# Patient Record
Sex: Female | Born: 1955 | Race: White | Hispanic: No | Marital: Married | Smoking: Never smoker
Health system: Southern US, Community
[De-identification: ages and names within clinical notes are randomized; demographics above are authoritative.]

## PROBLEM LIST (undated history)

## (undated) DIAGNOSIS — I83811 Varicose veins of right lower extremities with pain: Secondary | ICD-10-CM

## (undated) DIAGNOSIS — M25511 Pain in right shoulder: Secondary | ICD-10-CM

## (undated) DIAGNOSIS — M810 Age-related osteoporosis without current pathological fracture: Secondary | ICD-10-CM

## (undated) DIAGNOSIS — G43909 Migraine, unspecified, not intractable, without status migrainosus: Secondary | ICD-10-CM

## (undated) DIAGNOSIS — M5020 Other cervical disc displacement, unspecified cervical region: Secondary | ICD-10-CM

## (undated) DIAGNOSIS — I341 Nonrheumatic mitral (valve) prolapse: Secondary | ICD-10-CM

## (undated) DIAGNOSIS — E673 Hypervitaminosis D: Secondary | ICD-10-CM

## (undated) DIAGNOSIS — I493 Ventricular premature depolarization: Secondary | ICD-10-CM

## (undated) DIAGNOSIS — H3530 Unspecified macular degeneration: Secondary | ICD-10-CM

## (undated) DIAGNOSIS — N949 Unspecified condition associated with female genital organs and menstrual cycle: Secondary | ICD-10-CM

## (undated) DIAGNOSIS — J329 Chronic sinusitis, unspecified: Secondary | ICD-10-CM

## (undated) DIAGNOSIS — M6289 Other specified disorders of muscle: Secondary | ICD-10-CM

## (undated) HISTORY — DX: Other specified disorders of muscle: M62.89

## (undated) HISTORY — DX: Nonrheumatic mitral (valve) prolapse: I34.1

## (undated) HISTORY — DX: Ventricular premature depolarization: I49.3

## (undated) HISTORY — DX: Other cervical disc displacement, unspecified cervical region: M50.20

## (undated) HISTORY — PX: KNEE ARTHROSCOPY: SUR90

## (undated) HISTORY — DX: Pain in right shoulder: M25.511

## (undated) HISTORY — PX: APPENDECTOMY: SHX54

## (undated) HISTORY — DX: Unspecified condition associated with female genital organs and menstrual cycle: N94.9

## (undated) HISTORY — DX: Varicose veins of right lower extremity with pain: I83.811

## (undated) HISTORY — DX: Migraine, unspecified, not intractable, without status migrainosus: G43.909

## (undated) HISTORY — DX: Chronic sinusitis, unspecified: J32.9

## (undated) HISTORY — PX: BREAST LUMPECTOMY: SHX2

## (undated) HISTORY — DX: Hypervitaminosis D: E67.3

---

## 2004-11-24 ENCOUNTER — Ambulatory Visit: Payer: Self-pay | Admitting: Internal Medicine

## 2007-09-05 ENCOUNTER — Ambulatory Visit: Payer: Self-pay | Admitting: Internal Medicine

## 2008-11-19 ENCOUNTER — Ambulatory Visit: Payer: Self-pay | Admitting: Family Medicine

## 2008-11-20 ENCOUNTER — Ambulatory Visit: Payer: Self-pay | Admitting: Family Medicine

## 2010-12-09 ENCOUNTER — Ambulatory Visit: Payer: Self-pay | Admitting: Family Medicine

## 2010-12-10 ENCOUNTER — Ambulatory Visit: Payer: Self-pay | Admitting: Family Medicine

## 2015-06-22 ENCOUNTER — Emergency Department (HOSPITAL_COMMUNITY)
Admission: EM | Admit: 2015-06-22 | Discharge: 2015-06-22 | Disposition: A | Discharge status: Home / Self Care | Payer: BLUE CROSS/BLUE SHIELD | Attending: Emergency Medicine | Admitting: Emergency Medicine

## 2015-06-22 ENCOUNTER — Emergency Department (HOSPITAL_COMMUNITY): Payer: BLUE CROSS/BLUE SHIELD

## 2015-06-22 ENCOUNTER — Encounter (HOSPITAL_COMMUNITY): Payer: Self-pay | Admitting: Emergency Medicine

## 2015-06-22 DIAGNOSIS — Z79899 Other long term (current) drug therapy: Secondary | ICD-10-CM | POA: Insufficient documentation

## 2015-06-22 DIAGNOSIS — Z8679 Personal history of other diseases of the circulatory system: Secondary | ICD-10-CM | POA: Diagnosis not present

## 2015-06-22 DIAGNOSIS — Z9089 Acquired absence of other organs: Secondary | ICD-10-CM | POA: Insufficient documentation

## 2015-06-22 DIAGNOSIS — R1011 Right upper quadrant pain: Secondary | ICD-10-CM | POA: Diagnosis not present

## 2015-06-22 DIAGNOSIS — M25511 Pain in right shoulder: Secondary | ICD-10-CM | POA: Diagnosis present

## 2015-06-22 DIAGNOSIS — M549 Dorsalgia, unspecified: Secondary | ICD-10-CM | POA: Insufficient documentation

## 2015-06-22 DIAGNOSIS — M898X1 Other specified disorders of bone, shoulder: Secondary | ICD-10-CM

## 2015-06-22 HISTORY — DX: Nonrheumatic mitral (valve) prolapse: I34.1

## 2015-06-22 LAB — COMPREHENSIVE METABOLIC PANEL
ALT: 14 U/L (ref 14–54)
AST: 25 U/L (ref 15–41)
Albumin: 4.3 g/dL (ref 3.5–5.0)
Alkaline Phosphatase: 78 U/L (ref 38–126)
Anion gap: 9 (ref 5–15)
BUN: 13 mg/dL (ref 6–20)
CO2: 29 mmol/L (ref 22–32)
Calcium: 9.2 mg/dL (ref 8.9–10.3)
Chloride: 98 mmol/L — ABNORMAL LOW (ref 101–111)
Creatinine, Ser: 0.78 mg/dL (ref 0.44–1.00)
GFR calc Af Amer: >60 mL/min (ref >60)
GFR calc non Af Amer: >60 mL/min (ref >60)
Glucose, Bld: 94 mg/dL (ref 65–99)
Potassium: 4.1 mmol/L (ref 3.5–5.1)
Sodium: 136 mmol/L (ref 135–145)
Total Bilirubin: 0.8 mg/dL (ref 0.3–1.2)
Total Protein: 7.4 g/dL (ref 6.5–8.1)

## 2015-06-22 LAB — URINE MICROSCOPIC-ADD ON

## 2015-06-22 LAB — CBC WITH DIFFERENTIAL/PLATELET
Basophils Absolute: 0 10*3/uL (ref 0.0–0.1)
Basophils Relative: 1 % (ref 0–1)
Eosinophils Absolute: 0.1 10*3/uL (ref 0.0–0.7)
Eosinophils Relative: 3 % (ref 0–5)
HCT: 39.2 % (ref 36.0–46.0)
Hemoglobin: 13.5 g/dL (ref 12.0–15.0)
Lymphocytes Relative: 30 % (ref 12–46)
Lymphs Abs: 1.3 10*3/uL (ref 0.7–4.0)
MCH: 30.7 pg (ref 26.0–34.0)
MCHC: 34.4 g/dL (ref 30.0–36.0)
MCV: 89.1 fL (ref 78.0–100.0)
Monocytes Absolute: 0.3 10*3/uL (ref 0.1–1.0)
Monocytes Relative: 7 % (ref 3–12)
Neutro Abs: 2.7 10*3/uL (ref 1.7–7.7)
Neutrophils Relative %: 59 % (ref 43–77)
Platelets: 164 10*3/uL (ref 150–400)
RBC: 4.4 MIL/uL (ref 3.87–5.11)
RDW: 12.6 % (ref 11.5–15.5)
WBC: 4.4 10*3/uL (ref 4.0–10.5)

## 2015-06-22 LAB — URINALYSIS, ROUTINE W REFLEX MICROSCOPIC
Bilirubin Urine: NEGATIVE
Glucose, UA: NEGATIVE mg/dL
Hgb urine dipstick: NEGATIVE
Ketones, ur: NEGATIVE mg/dL
Nitrite: NEGATIVE
Protein, ur: NEGATIVE mg/dL
Specific Gravity, Urine: 1.012 (ref 1.005–1.030)
Urobilinogen, UA: 0.2 mg/dL (ref 0.0–1.0)
pH: 7 (ref 5.0–8.0)

## 2015-06-22 LAB — LIPASE, BLOOD: Lipase: 25 U/L (ref 22–51)

## 2015-06-22 MED ORDER — OXYCODONE-ACETAMINOPHEN 5-325 MG PO TABS: 2.0000 | ORAL_TABLET | ORAL | Status: DC | PRN

## 2015-06-22 MED ORDER — METHOCARBAMOL 500 MG PO TABS
500.0000 mg | ORAL_TABLET | Freq: Once | ORAL | Status: AC
  Administered 2015-06-22: 500 mg via ORAL
  Filled 2015-06-22: qty 1

## 2015-06-22 MED ORDER — METHOCARBAMOL 500 MG PO TABS: 500.0000 mg | ORAL_TABLET | Freq: Two times a day (BID) | ORAL | Status: DC

## 2015-06-22 MED ORDER — MORPHINE SULFATE 4 MG/ML IJ SOLN
4.0000 mg | Freq: Once | INTRAMUSCULAR | Status: AC
  Administered 2015-06-22: 3 mg via INTRAVENOUS
  Filled 2015-06-22: qty 1

## 2015-06-22 NOTE — Discharge Instructions (Signed)
Musculoskeletal Pain Return for any shortness of breath or chest pain. Follow-up with your physician and [facility].  Musculoskeletal pain is muscle and boney aches and pains. These pains can occur in any part of the body. Your caregiver may treat you without knowing the cause of the pain. They may treat you if blood or urine tests, X-rays, and other tests were normal.  CAUSES There is often not a definite cause or reason for these pains. These pains may be caused by a type of germ (virus). The discomfort may also come from overuse. Overuse includes working out too hard when your body is not fit. Boney aches also come from weather changes. Bone is sensitive to atmospheric pressure changes. HOME CARE INSTRUCTIONS   Ask when your test results will be ready. Make sure you get your test results.  Only take over-the-counter or prescription medicines for pain, discomfort, or fever as directed by your caregiver. If you were given medications for your condition, do not drive, operate machinery or power tools, or sign legal documents for 24 hours. Do not drink alcohol. Do not take sleeping pills or other medications that may interfere with treatment.  Continue all activities unless the activities cause more pain. When the pain lessens, slowly resume normal activities. Gradually increase the intensity and duration of the activities or exercise.  During periods of severe pain, bed rest may be helpful. Lay or sit in any position that is comfortable.  Putting ice on the injured area.  Put ice in a bag.  Place a towel between your skin and the bag.  Leave the ice on for 15 to 20 minutes, 3 to 4 times a day.  Follow up with your caregiver for continued problems and no reason can be found for the pain. If the pain becomes worse or does not go away, it may be necessary to repeat tests or do additional testing. Your caregiver may need to look further for a possible cause. SEEK IMMEDIATE MEDICAL CARE IF:  You have  pain that is getting worse and is not relieved by medications.  You develop chest pain that is associated with shortness or breath, sweating, feeling sick to your stomach (nauseous), or throw up (vomit).  Your pain becomes localized to the abdomen.  You develop any new symptoms that seem different or that concern you. MAKE SURE YOU:   Understand these instructions.  Will watch your condition.  Will get help right away if you are not doing well or get worse. Document Released: 12/07/2005 Document Revised: 02/29/2012 Document Reviewed: 08/11/2013 [facility] Patient Information 2015 [place], [place]. This information is not intended to replace advice given to you by your health care provider. Make sure you discuss any questions you have with your health care provider.

## 2015-06-22 NOTE — ED Provider Notes (Signed)
CSN: [id number]     Arrival date & time 06/22/15  1054 History   First MD Initiated Contact with Patient 06/22/15 1138     Chief Complaint  Patient presents with  . Back Pain     (Consider location/radiation/quality/duration/timing/severity/associated sxs/prior Treatment) Patient is a 59 y.o. female presenting with back pain. The history is provided by the patient. No language interpreter was used.  Back Pain Associated symptoms: abdominal pain   Associated symptoms: no dysuria and no fever   Ms. Benham is a 59 year old female with a history of mitral valve prolapse and appendectomy who presents for sharp, constant right shoulder pain that has progressively worsened for the past 4 days. She took ibuprofen with minimal relief. She states that ice does help as well as distraction. She states that when she is working she does not notice it until she stops doing something. She states she was seen by her chiropractor 2 days ago and he thought it may be her gallbladder. Her last bowel movement was normal this morning. She denies that it is worse after eating. She denies any injury or fall. She denies any fever, chills, chest pain, shortness of breath, cough, nausea, vomiting, diarrhea, constipation, hematochezia, dysuria, hematuria, urinary frequency, vaginal bleeding or discharge. She is postmenopausal. Past Medical History  Diagnosis Date  . MVP (mitral valve prolapse)    Past Surgical History  Procedure Laterality Date  . Appendectomy    . Cesarean section     History reviewed. No pertinent family history. History  Substance Use Topics  . Smoking status: Never Smoker   . Smokeless tobacco: Not on file  . Alcohol Use: No   OB History    No data available     Review of Systems  Constitutional: Negative for fever.  Gastrointestinal: Positive for abdominal pain. Negative for nausea, vomiting, diarrhea, constipation, blood in stool and abdominal distention.  Genitourinary: Negative for  dysuria.  Musculoskeletal: Positive for back pain.  Skin: Negative for rash.  All other systems reviewed and are negative.     Allergies  Chocolate; Contrast media; Gluten meal; Sulfa antibiotics; and Sulfate  Home Medications   Prior to Admission medications   Medication Sig Start Date End Date Taking? Authorizing Provider  Multiple Vitamin (MULTIVITAMIN WITH MINERALS) TABS tablet Take 1 tablet by mouth daily.   Yes Historical Provider, MD  Omega-3 Fatty Acids (FISH OIL) 1000 MG CAPS Take 1 capsule by mouth 2 (two) times daily.   Yes Historical Provider, MD  methocarbamol (ROBAXIN) 500 MG tablet Take 1 tablet (500 mg total) by mouth 2 (two) times daily. 06/22/15   Analy Bassford Patel-Mills, PA-C  oxyCODONE-acetaminophen (PERCOCET/ROXICET) 5-325 MG per tablet Take 2 tablets by mouth every 4 (four) hours as needed for severe pain. 06/22/15   Francois Elk Patel-Mills, PA-C   BP 158/85 mmHg  Pulse 63  Temp(Src) 98.1 F (36.7 C) (Oral)  Resp 16  SpO2 99% Physical Exam  Constitutional: She is oriented to person, place, and time. She appears well-developed and well-nourished.  HENT:  Head: Normocephalic and atraumatic.  Eyes: Conjunctivae are normal.  Neck: Normal range of motion. Neck supple.  Cardiovascular: Normal rate, regular rhythm and normal heart sounds.   Pulmonary/Chest: Effort normal and breath sounds normal.  Abdominal: Soft. Normal appearance. She exhibits no distension and no mass. There is no splenomegaly or hepatomegaly. There is no rigidity, no rebound, no guarding, no CVA tenderness, no tenderness at McBurney's point and negative Murphy's sign.    Musculoskeletal: Normal range  of motion.  No reproducible right scapular or right shoulder pain with palpation. Patient is able to abduct and adduct the right arm. She has a good radial pulse. Good strength. No scapular winging.  Neurological: She is alert and oriented to person, place, and time.  Skin: Skin is warm and dry.  Psychiatric:  She has a normal mood and affect. Her behavior is normal.  Nursing note and vitals reviewed.   ED Course  Procedures (including critical care time) Labs Review Labs Reviewed  COMPREHENSIVE METABOLIC PANEL - Abnormal; Notable for the following:    Chloride 98 (*)    All other components within normal limits  URINALYSIS, ROUTINE W REFLEX MICROSCOPIC (NOT AT [facility]) - Abnormal; Notable for the following:    APPearance CLOUDY (*)    Leukocytes, UA TRACE (*)    All other components within normal limits  URINE MICROSCOPIC-ADD ON - Abnormal; Notable for the following:    Squamous Epithelial / LPF MANY (*)    Bacteria, UA FEW (*)    All other components within normal limits  CBC WITH DIFFERENTIAL/PLATELET  LIPASE, BLOOD    Imaging Review US Abdomen Limited Ruq  06/22/2015   CLINICAL DATA:  Right upper quadrant pain for 4 days.  EXAM: US ABDOMEN LIMITED - RIGHT UPPER QUADRANT  COMPARISON:  None.  FINDINGS: Gallbladder:  Gallbladder is mildly distended but there is no evidence for stones or wall thickening. Sonographic Murphy's sign could not be evaluated because the patient was medicated.  Common bile duct:  Diameter: 6 mm  Liver:  No focal lesion identified. Within normal limits in parenchymal echogenicity.  IMPRESSION: No acute abnormality. Negative for gallstones or biliary dilatation.   Electronically Signed   By: Markus Daft M.D.   On: 06/22/2015 13:45     EKG Interpretation None      MDM   Final diagnoses:  RUQ pain  Pain of right scapula  Patient's vitals are stable and she is afebrile. Her labs are unremarkable. She has no UTI. Lipase is normal. Ultrasound of the right upper quadrant shows normal common bile duct with no cholelithiasis or cholecystitis. I was not concerned about a PE since the patient was not short of breath, had no cough, no recent surgery or estrogen use, no prior DVT or PE. I think patient's pain may be more musculoskeletal. I have given the patient robaxin and  percocet. She states that she can follow-up with her family physician at [facility] in 3 days. I gave the patient return precautions such as shortness of breath, vomiting, fever.    Ottie Glazier, PA-C 06/22/15 1547  Orlie Dakin, MD 06/22/15 1556

## 2015-06-22 NOTE — ED Notes (Addendum)
Pt c/o right scapula pain starting Wednesday that has increased since then. Has seen chiropractor and told her it was probably her gall bladder. Denies N/V/D. Denies urinary or bowel changes. Denies any other symptoms. No other c/c. 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 Says pain is constant and is not exacerbated or alleviated by any particular thing. Ambulatory with steady gait.

## 2015-06-26 ENCOUNTER — Ambulatory Visit
Admission: RE | Admit: 2015-06-26 | Discharge: 2015-06-26 | Disposition: A | Discharge status: Home / Self Care | Payer: BLUE CROSS/BLUE SHIELD | Source: Ambulatory Visit | Attending: Family Medicine | Admitting: Family Medicine

## 2015-06-26 ENCOUNTER — Other Ambulatory Visit: Payer: Self-pay | Admitting: Family Medicine

## 2015-06-26 DIAGNOSIS — M542 Cervicalgia: Secondary | ICD-10-CM

## 2015-06-26 DIAGNOSIS — M549 Dorsalgia, unspecified: Secondary | ICD-10-CM

## 2016-01-23 ENCOUNTER — Other Ambulatory Visit: Payer: Self-pay | Admitting: Family Medicine

## 2016-01-23 ENCOUNTER — Other Ambulatory Visit (HOSPITAL_COMMUNITY)
Admission: RE | Admit: 2016-01-23 | Discharge: 2016-01-23 | Disposition: A | Discharge status: Home / Self Care | Payer: BLUE CROSS/BLUE SHIELD | Source: Ambulatory Visit | Attending: Family Medicine | Admitting: Family Medicine

## 2016-01-23 DIAGNOSIS — Z124 Encounter for screening for malignant neoplasm of cervix: Secondary | ICD-10-CM | POA: Insufficient documentation

## 2016-01-24 ENCOUNTER — Other Ambulatory Visit: Payer: Self-pay

## 2016-01-24 DIAGNOSIS — Z1231 Encounter for screening mammogram for malignant neoplasm of breast: Secondary | ICD-10-CM

## 2016-01-28 LAB — CYTOLOGY - PAP

## 2016-02-10 ENCOUNTER — Ambulatory Visit
Admission: RE | Admit: 2016-02-10 | Discharge: 2016-02-10 | Disposition: A | Discharge status: Home / Self Care | Payer: BLUE CROSS/BLUE SHIELD | Source: Ambulatory Visit

## 2016-02-10 DIAGNOSIS — Z1231 Encounter for screening mammogram for malignant neoplasm of breast: Secondary | ICD-10-CM

## 2019-08-01 ENCOUNTER — Other Ambulatory Visit: Payer: Self-pay | Admitting: Family Medicine

## 2019-08-01 ENCOUNTER — Other Ambulatory Visit (HOSPITAL_COMMUNITY)
Admission: RE | Admit: 2019-08-01 | Discharge: 2019-08-01 | Disposition: A | Discharge status: Home / Self Care | Payer: Medicare Other | Source: Ambulatory Visit | Attending: Family Medicine | Admitting: Family Medicine

## 2019-08-01 DIAGNOSIS — Z780 Asymptomatic menopausal state: Secondary | ICD-10-CM | POA: Insufficient documentation

## 2019-08-01 DIAGNOSIS — M8588 Other specified disorders of bone density and structure, other site: Secondary | ICD-10-CM | POA: Diagnosis present

## 2019-08-01 DIAGNOSIS — Z1151 Encounter for screening for human papillomavirus (HPV): Secondary | ICD-10-CM | POA: Insufficient documentation

## 2019-08-03 LAB — CYTOLOGY - PAP
Diagnosis: NEGATIVE
HPV: NOT DETECTED

## 2019-11-01 ENCOUNTER — Other Ambulatory Visit: Payer: Self-pay | Admitting: Family Medicine

## 2019-11-01 DIAGNOSIS — R109 Unspecified abdominal pain: Secondary | ICD-10-CM

## 2019-11-07 ENCOUNTER — Ambulatory Visit
Admission: RE | Admit: 2019-11-07 | Discharge: 2019-11-07 | Disposition: A | Discharge status: Home / Self Care | Payer: Medicare Other | Source: Ambulatory Visit | Attending: Family Medicine | Admitting: Family Medicine

## 2019-11-07 DIAGNOSIS — R109 Unspecified abdominal pain: Secondary | ICD-10-CM

## 2020-03-14 ENCOUNTER — Other Ambulatory Visit: Payer: Self-pay | Admitting: Family Medicine

## 2020-03-14 DIAGNOSIS — Z1231 Encounter for screening mammogram for malignant neoplasm of breast: Secondary | ICD-10-CM

## 2020-03-25 ENCOUNTER — Other Ambulatory Visit: Payer: Self-pay

## 2020-03-25 ENCOUNTER — Ambulatory Visit
Admission: RE | Admit: 2020-03-25 | Discharge: 2020-03-25 | Disposition: A | Discharge status: Home / Self Care | Payer: Medicare Other | Source: Ambulatory Visit

## 2020-03-25 DIAGNOSIS — Z1231 Encounter for screening mammogram for malignant neoplasm of breast: Secondary | ICD-10-CM

## 2020-08-05 ENCOUNTER — Other Ambulatory Visit: Payer: Self-pay | Admitting: Family Medicine

## 2020-08-05 DIAGNOSIS — M8588 Other specified disorders of bone density and structure, other site: Secondary | ICD-10-CM

## 2020-09-03 ENCOUNTER — Ambulatory Visit (INDEPENDENT_AMBULATORY_CARE_PROVIDER_SITE_OTHER): Payer: Medicare Other | Admitting: Ophthalmology

## 2020-09-03 ENCOUNTER — Other Ambulatory Visit: Payer: Self-pay

## 2020-09-03 ENCOUNTER — Encounter (INDEPENDENT_AMBULATORY_CARE_PROVIDER_SITE_OTHER): Payer: Self-pay | Admitting: Ophthalmology

## 2020-09-03 DIAGNOSIS — H2511 Age-related nuclear cataract, right eye: Secondary | ICD-10-CM

## 2020-09-03 DIAGNOSIS — H353132 Nonexudative age-related macular degeneration, bilateral, intermediate dry stage: Secondary | ICD-10-CM

## 2020-09-03 DIAGNOSIS — H43391 Other vitreous opacities, right eye: Secondary | ICD-10-CM | POA: Diagnosis not present

## 2020-09-03 DIAGNOSIS — H2512 Age-related nuclear cataract, left eye: Secondary | ICD-10-CM | POA: Diagnosis not present

## 2020-09-03 NOTE — Progress Notes (Signed)
09/03/2020     CHIEF COMPLAINT Patient presents for Retina Follow Up   HISTORY OF PRESENT ILLNESS: Alicia Green is a 64 y.o. female who presents to the clinic today for:   HPI    Retina Follow Up    Patient presents with  Dry AMD.  In both eyes.  Severity is moderate.  Duration of 1 year.  Since onset it is stable.  I, the attending physician,  performed the HPI with the patient and updated documentation appropriately.          Comments    1 Year f\u OU. OCT  Pt states no changes in vision. Denies any complaints. Pt saw Dr. Laban Emperor last week, would like [place] to send notes.       Last edited by Tilda Franco on 09/03/2020  8:08 AM. (History)      Referring physician: Lawerance Cruel, MD [place],  [place] [id number]  HISTORICAL INFORMATION:   Selected notes from the [place]: No current outpatient medications on file. (Ophthalmic Drugs)   No current facility-administered medications for this visit. (Ophthalmic Drugs)   Current Outpatient Medications (Other)  Medication Sig  . methocarbamol (ROBAXIN) 500 MG tablet Take 1 tablet (500 mg total) by mouth 2 (two) times daily.  . Multiple Vitamin (MULTIVITAMIN WITH MINERALS) TABS tablet Take 1 tablet by mouth daily.  . Omega-3 Fatty Acids (FISH OIL) 1000 MG CAPS Take 1 capsule by mouth 2 (two) times daily.  Marland Kitchen oxyCODONE-acetaminophen (PERCOCET/ROXICET) 5-325 MG per tablet Take 2 tablets by mouth every 4 (four) hours as needed for severe pain.   No current facility-administered medications for this visit. (Other)      REVIEW OF SYSTEMS:    ALLERGIES Allergies  Allergen Reactions  . Chocolate     migraines  . Contrast Media [Iodinated Diagnostic Agents] Swelling  . Gluten Meal     migraines  . Sulfa Antibiotics Itching  . Sulfate     migraines    PAST MEDICAL HISTORY Past Medical History:  Diagnosis Date  . MVP (mitral valve prolapse)    Past  Surgical History:  Procedure Laterality Date  . APPENDECTOMY    . CESAREAN SECTION      FAMILY HISTORY History reviewed. No pertinent family history.  SOCIAL HISTORY Social History   Tobacco Use  . Smoking status: Never Smoker  . Smokeless tobacco: Never Used  Substance Use Topics  . Alcohol use: No  . Drug use: Not on file         OPHTHALMIC EXAM:  Base Eye Exam    Visual Acuity (Snellen - Linear)      Right Left   Dist [place] 20/20 -1 20/25 -2       Tonometry (Tonopen, 8:12 AM)      Right Left   Pressure 12 11       Pupils      Pupils Dark Light Shape React APD   Right PERRL 4 3 Round Brisk None   Left PERRL 4 3 Round Brisk None       Visual Fields (Counting fingers)      Left Right    Full Full       Neuro/Psych    Oriented x3: Yes   Mood/Affect: Normal       Dilation    Both eyes: 1.0% Mydriacyl, 2.5% Phenylephrine @ 8:12 AM  Slit Lamp and Fundus Exam    External Exam      Right Left   External Normal Normal       Slit Lamp Exam      Right Left   Lids/Lashes Normal Normal   Conjunctiva/Sclera White and quiet White and quiet   Cornea Clear Clear   Anterior Chamber Deep and quiet Deep and quiet   Iris Round and reactive Round and reactive   Lens 1+ Nuclear sclerosis 1+ Nuclear sclerosis   Anterior Vitreous Normal Normal       Fundus Exam      Right Left   Posterior Vitreous Normal Normal   Disc Normal Normal   C/D Ratio 0.1 0.1   Macula Soft drusen, no exudates, no macular thickening Soft drusen, no exudates, no macular thickening   Vessels Normal Normal   Periphery Normal Normal          IMAGING AND PROCEDURES  Imaging and Procedures for 09/03/20  OCT, Retina - OU - Both Eyes       Right Eye Quality was good. Scan locations included subfoveal. Central Foveal Thickness: 300. Progression has been stable. Findings include abnormal foveal contour, no IRF, retinal drusen , no SRF.   Left Eye Quality was good. Scan  locations included subfoveal. Central Foveal Thickness: 300. Progression has been stable. Findings include abnormal foveal contour, retinal drusen , no IRF, no SRF.   Notes Posterior vitreous detachment OU, no vitreal retinal traction                ASSESSMENT/PLAN:  Intermediate stage nonexudative age-related macular degeneration of both eyes The nature of age--related macular degeneration was discussed with the patient as well as the distinction between dry and wet types. Checking an Amsler Grid daily with advice to return immediately should a distortion develop, was given to the patient. The patient 's smoking status now and in the past was determined and advice based on the AREDS study was provided regarding the consumption of antioxidant supplements. AREDS 2 vitamin formulation was recommended. Consumption of dark leafy vegetables and fresh fruits of various colors was recommended. Treatment modalities for wet macular degeneration particularly the use of intravitreal injections of anti-blood vessel growth factors was discussed with the patient. Avastin, Lucentis, and Eylea are the available options. On occasion, therapy includes the use of photodynamic therapy and thermal laser. Stressed to the patient do not rub eyes.  Patient was advised to check Amsler Grid daily and return immediately if changes are noted. Instructions on using the grid were given to the patient. All patient questions were answered.  Nuclear sclerotic cataract of left eye The nature of cataract was discussed with the patient as well as the elective nature of surgery. The patient was reassured that surgery at a later date does not put the patient at risk for a worse outcome. It was emphasized that the need for surgery is dictated by the patient's quality of life as influenced by the cataract. Patient was instructed to maintain close follow up with their general eye care doctor.      ICD-10-CM   1. Intermediate stage  nonexudative age-related macular degeneration of both eyes  H35.3132 OCT, Retina - OU - Both Eyes  2. Nuclear sclerotic cataract of right eye  H25.11   3. Other vitreous opacities, right eye  H43.391   4. Nuclear sclerotic cataract of left eye  H25.12     1.  Findings reviewed with the patient.  Mild to  intermediate age-related macular degeneration present in each eye.  No high risk features.  2.  She continues on oral PreserVision.  3.  Patient I did discuss the notion of possible magnesium supplementation to try to prevent or treat any type of ophthalmic migraine or other migraine type events.  Person active in sports and other activities, this would also replenish that lost in perspiration.  4.  I have asked the patient to follow-up over the course of the next year and a regularly scheduled basis with Dr. Willow Ora.  Patient understands we stand ready to help her and assist her in any way in this office should the need arise. Follow-up scheduled here in 2 years as a safety net  Ophthalmic Meds Ordered this visit:  No orders of the defined types were placed in this encounter.      Return in about 2 years (around 09/03/2022) for DILATE OU, OCT.  There are no Patient Instructions on file for this visit.   Explained the diagnoses, plan, and follow up with the patient and they expressed understanding.  Patient expressed understanding of the importance of proper follow up care.   Clent Demark Rickayla Wieland M.D. Diseases & Surgery of the Retina and Vitreous [facility] [place] 09/03/20     Abbreviations: M myopia (nearsighted); A astigmatism; H hyperopia (farsighted); P presbyopia; Mrx spectacle prescription;  CTL contact lenses; OD right eye; OS left eye; OU both eyes  XT exotropia; ET esotropia; PEK punctate epithelial keratitis; PEE punctate epithelial erosions; DES dry eye syndrome; MGD meibomian gland dysfunction; ATs artificial tears; PFAT's preservative free artificial tears; [place]  nuclear sclerotic cataract; PSC posterior subcapsular cataract; ERM epi-retinal membrane; PVD posterior vitreous detachment; RD retinal detachment; DM diabetes mellitus; DR diabetic retinopathy; NPDR non-proliferative diabetic retinopathy; PDR proliferative diabetic retinopathy; CSME clinically significant macular edema; DME diabetic macular edema; dbh dot blot hemorrhages; CWS cotton wool spot; POAG primary open angle glaucoma; C/D cup-to-disc ratio; HVF humphrey visual field; GVF goldmann visual field; OCT optical coherence tomography; IOP intraocular pressure; BRVO Branch retinal vein occlusion; CRVO central retinal vein occlusion; CRAO central retinal artery occlusion; BRAO branch retinal artery occlusion; RT retinal tear; SB scleral buckle; PPV pars plana vitrectomy; VH Vitreous hemorrhage; PRP panretinal laser photocoagulation; IVK intravitreal kenalog; VMT vitreomacular traction; MH Macular hole;  NVD neovascularization of the disc; NVE neovascularization elsewhere; AREDS age related eye disease study; ARMD age related macular degeneration; POAG primary open angle glaucoma; EBMD epithelial/anterior basement membrane dystrophy; ACIOL anterior chamber intraocular lens; IOL intraocular lens; PCIOL posterior chamber intraocular lens; Phaco/IOL phacoemulsification with intraocular lens placement; [place] photorefractive keratectomy; LASIK laser assisted in situ keratomileusis; HTN hypertension; DM diabetes mellitus; COPD chronic obstructive pulmonary disease

## 2020-09-03 NOTE — Assessment & Plan Note (Signed)

## 2020-09-03 NOTE — Assessment & Plan Note (Signed)

## 2020-10-23 ENCOUNTER — Other Ambulatory Visit: Payer: Self-pay

## 2020-10-23 ENCOUNTER — Ambulatory Visit (INDEPENDENT_AMBULATORY_CARE_PROVIDER_SITE_OTHER): Payer: Medicare Other | Admitting: Orthopedic Surgery

## 2020-10-23 DIAGNOSIS — M542 Cervicalgia: Secondary | ICD-10-CM | POA: Diagnosis not present

## 2020-10-24 ENCOUNTER — Encounter: Payer: Self-pay | Admitting: Orthopedic Surgery

## 2020-10-24 NOTE — Progress Notes (Signed)
Office Visit Note   Patient: Alicia Green           Date of Birth: 1956/03/12           MRN: [id number] Visit Date: 10/23/2020 Requested by: Lawerance Cruel, [place],  [place] [address] PCP: Lawerance Cruel, MD  Subjective: Chief Complaint  Patient presents with  . Neck - Pain    HPI: Alicia Green is a 64 year old patient with neck pain. Started after a Covid booster 3 weeks ago. Patient states the pain comes and goes. She is a retired Transport planner. Has a prior history of neck pain. Those old records are reviewed. Had an MRI scan in 1999 which showed some mild right-sided spurring with mild nerve compression. Radiographs 5 years ago demonstrated mild arthritis. Currently she is having symptoms on the left-hand side. She uses natural medication for inflammation. No history of epidural steroid injections. The pain will occasionally wake her from sleep at night but it is gradually improving. She is really here proactively to make sure nothing else requires intervention. She does report some radicular pain when she looks up and down at times. She is getting ready to be admitted to a sports College because of her racquetball ability.              ROS: All systems reviewed are negative as they relate to the chief complaint within the history of present illness.  Patient denies  fevers or chills.   Assessment & Plan: Visit Diagnoses:  1. Neck pain     Plan: Impression is neck pain which is mild at this time. Taking natural medication for inflammation. Has a little bit of restricted neck range of motion in terms of flexion and extension. Has a history of mild cervical spine spondylosis. I think once her clinical symptoms reach a critical threshold level I would favor either anti-inflammatories orally or Medrol Dosepak and muscle relaxers. That can be followed up with cervical spine radiographs and MRI scan to prepare for cervical spine and epidural steroid injections.  Currently her symptoms are not really to that level. I think we will have to let her symptoms guide [place] as to the level of intervention that we perform. In general she is relatively asymptomatic in the symptoms she has she can treat well with her remedies. When that changes I think she should come back for further work-up and intervention.   Follow-Up Instructions: Return if symptoms worsen or fail to improve.   Orders:  No orders of the defined types were placed in this encounter.  No orders of the defined types were placed in this encounter.     Procedures: No procedures performed   Clinical Data: No additional findings.  Objective: Vital Signs: There were no vitals taken for this visit.  Physical Exam:   Constitutional: Patient appears well-developed HEENT:  Head: Normocephalic Eyes:EOM are normal Neck: Normal range of motion Cardiovascular: Normal rate Pulmonary/chest: Effort normal Neurologic: Patient is alert Skin: Skin is warm Psychiatric: Patient has normal mood and affect   Ortho Exam: Ortho exam demonstrates flexion within an inch of chin to chest extension is about 30 degrees rotation is 65 degrees to 75 degrees bilaterally. Patient has 5 out of 5 grip EPL FPL interosseous are/extension bicep triceps and deltoid strength. Mild paresthesias left deltoid versus right. Radial pulse intact bilaterally. Reflexes 1+ out of 4 bilateral biceps and triceps. No masses lymphadenopathy or skin changes noted in the shoulder girdle  or neck region. Both shoulder exams have excellent range of motion rotator cuff strength with no restriction of passive range of motion. Muscle tone is excellent.  Specialty Comments:  No specialty comments available.  Imaging: No results found.   PMFS History: Patient Active Problem List   Diagnosis Date Noted  . Intermediate stage nonexudative age-related macular degeneration of both eyes 09/03/2020  . Nuclear sclerotic cataract of right eye  09/03/2020  . Other vitreous opacities, right eye 09/03/2020  . Nuclear sclerotic cataract of left eye 09/03/2020   Past Medical History:  Diagnosis Date  . MVP (mitral valve prolapse)     History reviewed. No pertinent family history.  Past Surgical History:  Procedure Laterality Date  . APPENDECTOMY    . CESAREAN SECTION     Social History   Occupational History  . Not on file  Tobacco Use  . Smoking status: Never Smoker  . Smokeless tobacco: Never Used  Substance and Sexual Activity  . Alcohol use: No  . Drug use: Not on file  . Sexual activity: Not on file

## 2020-12-02 ENCOUNTER — Other Ambulatory Visit: Payer: Self-pay | Admitting: Family Medicine

## 2020-12-02 DIAGNOSIS — Z1231 Encounter for screening mammogram for malignant neoplasm of breast: Secondary | ICD-10-CM

## 2021-01-03 DIAGNOSIS — G43909 Migraine, unspecified, not intractable, without status migrainosus: Secondary | ICD-10-CM | POA: Diagnosis not present

## 2021-01-03 DIAGNOSIS — I493 Ventricular premature depolarization: Secondary | ICD-10-CM | POA: Diagnosis not present

## 2021-01-14 DIAGNOSIS — I83811 Varicose veins of right lower extremities with pain: Secondary | ICD-10-CM | POA: Diagnosis not present

## 2021-01-14 DIAGNOSIS — I8311 Varicose veins of right lower extremity with inflammation: Secondary | ICD-10-CM | POA: Diagnosis not present

## 2021-01-21 DIAGNOSIS — I83812 Varicose veins of left lower extremities with pain: Secondary | ICD-10-CM | POA: Diagnosis not present

## 2021-01-21 DIAGNOSIS — M7981 Nontraumatic hematoma of soft tissue: Secondary | ICD-10-CM | POA: Diagnosis not present

## 2021-01-21 DIAGNOSIS — I8312 Varicose veins of left lower extremity with inflammation: Secondary | ICD-10-CM | POA: Diagnosis not present

## 2021-01-28 ENCOUNTER — Encounter (HOSPITAL_COMMUNITY): Payer: Self-pay | Admitting: *Deleted

## 2021-01-28 ENCOUNTER — Emergency Department (HOSPITAL_COMMUNITY)
Admission: EM | Admit: 2021-01-28 | Discharge: 2021-01-29 | Disposition: A | Discharge status: Home / Self Care | Payer: Medicare Other | Attending: Emergency Medicine | Admitting: Emergency Medicine

## 2021-01-28 ENCOUNTER — Other Ambulatory Visit: Payer: Self-pay

## 2021-01-28 DIAGNOSIS — Y9373 Activity, racquet and hand sports: Secondary | ICD-10-CM | POA: Diagnosis not present

## 2021-01-28 DIAGNOSIS — R55 Syncope and collapse: Secondary | ICD-10-CM | POA: Diagnosis not present

## 2021-01-28 DIAGNOSIS — R110 Nausea: Secondary | ICD-10-CM | POA: Diagnosis not present

## 2021-01-28 DIAGNOSIS — I8311 Varicose veins of right lower extremity with inflammation: Secondary | ICD-10-CM | POA: Diagnosis not present

## 2021-01-28 DIAGNOSIS — R001 Bradycardia, unspecified: Secondary | ICD-10-CM | POA: Diagnosis not present

## 2021-01-28 DIAGNOSIS — S6991XA Unspecified injury of right wrist, hand and finger(s), initial encounter: Secondary | ICD-10-CM | POA: Diagnosis not present

## 2021-01-28 DIAGNOSIS — W19XXXA Unspecified fall, initial encounter: Secondary | ICD-10-CM | POA: Insufficient documentation

## 2021-01-28 DIAGNOSIS — Z5321 Procedure and treatment not carried out due to patient leaving prior to being seen by health care provider: Secondary | ICD-10-CM | POA: Diagnosis not present

## 2021-01-28 DIAGNOSIS — R52 Pain, unspecified: Secondary | ICD-10-CM | POA: Diagnosis not present

## 2021-01-28 LAB — URINALYSIS, ROUTINE W REFLEX MICROSCOPIC
Bilirubin Urine: NEGATIVE
Glucose, UA: NEGATIVE mg/dL
Hgb urine dipstick: NEGATIVE
Ketones, ur: 5 mg/dL — AB
Nitrite: NEGATIVE
Protein, ur: NEGATIVE mg/dL
Specific Gravity, Urine: 1.009 (ref 1.005–1.030)
pH: 7 (ref 5.0–8.0)

## 2021-01-28 LAB — BASIC METABOLIC PANEL
Anion gap: 10 (ref 5–15)
BUN: 15 mg/dL (ref 8–23)
CO2: 23 mmol/L (ref 22–32)
Calcium: 8.8 mg/dL — ABNORMAL LOW (ref 8.9–10.3)
Chloride: 100 mmol/L (ref 98–111)
Creatinine, Ser: 0.76 mg/dL (ref 0.44–1.00)
GFR, Estimated: >60 mL/min (ref >60)
Glucose, Bld: 108 mg/dL — ABNORMAL HIGH (ref 70–99)
Potassium: 3.8 mmol/L (ref 3.5–5.1)
Sodium: 133 mmol/L — ABNORMAL LOW (ref 135–145)

## 2021-01-28 LAB — CBC
HCT: 37.6 % (ref 36.0–46.0)
Hemoglobin: 12.4 g/dL (ref 12.0–15.0)
MCH: 30.3 pg (ref 26.0–34.0)
MCHC: 33 g/dL (ref 30.0–36.0)
MCV: 91.9 fL (ref 80.0–100.0)
Platelets: 201 10*3/uL (ref 150–400)
RBC: 4.09 MIL/uL (ref 3.87–5.11)
RDW: 12.9 % (ref 11.5–15.5)
WBC: 9.5 10*3/uL (ref 4.0–10.5)
nRBC: 0 % (ref 0.0–0.2)

## 2021-01-28 NOTE — ED Triage Notes (Signed)
Pt arrived by [facility] from home. Pt had a mechanical fall today while playing pickelball and had right wrist pain. Went to [facility] and had neg xray. Pt reports stilll not feeling well, had syncopal episode at home while sitting in chair. Husband told ems that episode lasted approx 2 mins. Per ems, pt is orthostatic, given 140ml NS pta.

## 2021-01-29 NOTE — ED Notes (Signed)
Pt states they are leaving  

## 2021-01-31 DIAGNOSIS — G43909 Migraine, unspecified, not intractable, without status migrainosus: Secondary | ICD-10-CM | POA: Diagnosis not present

## 2021-01-31 DIAGNOSIS — R002 Palpitations: Secondary | ICD-10-CM | POA: Diagnosis not present

## 2021-01-31 DIAGNOSIS — R55 Syncope and collapse: Secondary | ICD-10-CM | POA: Diagnosis not present

## 2021-02-10 DIAGNOSIS — I83812 Varicose veins of left lower extremities with pain: Secondary | ICD-10-CM | POA: Diagnosis not present

## 2021-02-10 DIAGNOSIS — I8312 Varicose veins of left lower extremity with inflammation: Secondary | ICD-10-CM | POA: Diagnosis not present

## 2021-02-20 ENCOUNTER — Ambulatory Visit (INDEPENDENT_AMBULATORY_CARE_PROVIDER_SITE_OTHER): Payer: Medicare Other | Admitting: Cardiology

## 2021-02-20 ENCOUNTER — Other Ambulatory Visit: Payer: Self-pay

## 2021-02-20 ENCOUNTER — Encounter: Payer: Self-pay | Admitting: Cardiology

## 2021-02-20 VITALS — BP 110/80 | HR 59 | Ht 66.0 in | Wt 133.0 lb

## 2021-02-20 DIAGNOSIS — R072 Precordial pain: Secondary | ICD-10-CM

## 2021-02-20 DIAGNOSIS — R55 Syncope and collapse: Secondary | ICD-10-CM

## 2021-02-20 DIAGNOSIS — Z01812 Encounter for preprocedural laboratory examination: Secondary | ICD-10-CM | POA: Diagnosis not present

## 2021-02-20 MED ORDER — METOPROLOL TARTRATE 25 MG PO TABS: 25.0000 mg | ORAL_TABLET | Freq: Once | ORAL | 0 refills | Status: DC

## 2021-02-20 NOTE — Progress Notes (Signed)
Cardiology Office Note:    Date:  02/20/2021   ID:  Alicia Green, DOB 1956-01-11, MRN [id number]  PCP:  Cari Caraway, [place]  Cardiologist:  Candee Furbish, MD  Advanced Practice Provider:  No care team member to display Electrophysiologist:  None       Referring MD: Cari Caraway, MD     History of Present Illness:    Alicia Green is a 65 y.o. female here for the evaluation of syncopal episode at home while sitting in chair.  She has had several episodes of heart feeling as though it is doing a "big contraction and jumping out of her chest.  Occurs after she lays down at night for a few beats.  No chest pain no shortness of breath.  She is also had migraine, non-smoker and echocardiogram was performed on 09/09/2001 many years ago that showed mild mitral valve prolapse with mild mitral vegetation with normal left ventricular function.  Varicose veins are getting treated. Migraine after. Rare episodes. Controlled with food. MD thought maybe PFO.   Felt heart pounding in the evening, 2 days after vein tratment. These were intense. They did go away. She is an athelete.   Fell pickle ball, that night wrist hurt, passed out, vagal. RBBB on ECG with first degree AVB. Daughter is a Marine scientist.   ?PFO PVC Pass out  Walking with duaghter felt a little dizzy, when dringking felt better. Some chest pressure when walking, going up hill.   Has a history of dehydration.   Dad had HTN, CVA Mother had angina, ?MVP  She is a physical therapist.  Sister MVP  Never smoke, no ETOH. Work out often  Enjoys Actuary.  Past Medical History:  Diagnosis Date  . High vitamin D level   . Migraine headache   . Mitral valve prolapse   . Muscular degeneration   . MVP (mitral valve prolapse)   . Protrusion of cervical intervertebral disc   . PVC (premature ventricular contraction)   . PVC (premature ventricular contraction)   . Right shoulder pain   .  Sinusitis   . Vaginal discomfort   . Varicose veins of right lower extremity with pain     Past Surgical History:  Procedure Laterality Date  . APPENDECTOMY    . BREAST LUMPECTOMY    . CESAREAN SECTION    . KNEE ARTHROSCOPY      Current Medications: Current Meds  Medication Sig  . Ashwagandha 500 MG CAPS Take by mouth.  . Cholecalciferol (VITAMIN D3) 25 MCG (1000 UT) CAPS Take by mouth.  Marland Kitchen glucosamine-chondroitin 500-400 MG tablet Take by mouth 3 (three) times daily.  . Glutamine 5 g PACK Take by mouth.  . methocarbamol (ROBAXIN) 500 MG tablet Take 1 tablet (500 mg total) by mouth 2 (two) times daily.  . metoprolol tartrate (LOPRESSOR) 25 MG tablet Take 1 tablet (25 mg total) by mouth once for 1 dose. Patient to take 1 tablet 2 hours prior to CT  . Multiple Vitamin (MULTIVITAMIN WITH MINERALS) TABS tablet Take 1 tablet by mouth daily.  . Multiple Vitamins-Minerals (PRESERVISION AREDS 2 PO) Take by mouth.  . Omega-3 Fatty Acids (FISH OIL) 1000 MG CAPS Take 1 capsule by mouth 2 (two) times daily.  Marland Kitchen oxyCODONE-acetaminophen (PERCOCET/ROXICET) 5-325 MG per tablet Take 2 tablets by mouth every 4 (four) hours as needed for severe pain.     Allergies:   Amoxicillin, Chocolate, Gluten meal, Sulfa antibiotics, and  Sulfate   Social History   Socioeconomic History  . Marital status: Married    Spouse name: Not on file  . Number of children: Not on file  . Years of education: Not on file  . Highest education level: Not on file  Occupational History  . Not on file  Tobacco Use  . Smoking status: Never Smoker  . Smokeless tobacco: Never Used  Substance and Sexual Activity  . Alcohol use: No  . Drug use: Not on file  . Sexual activity: Not on file  Other Topics Concern  . Not on file  Social History Narrative  . Not on file   Social Determinants of Health   Financial Resource Strain: Not on file  Food Insecurity: Not on file  Transportation Needs: Not on file  Physical  Activity: Not on file  Stress: Not on file  Social Connections: Not on file     Family History: The patient's family history includes Alzheimer's disease in her mother; Bipolar disorder in her sister; Cancer in her father; Hepatitis in her brother; Hypertension in her father; Kidney disease in her sister.  ROS:   Please see the history of present illness.     All other systems reviewed and are negative.  EKGs/Labs/Other Studies Reviewed:    The following studies were reviewed today: Prior echocardiogram, office notes reviewed  EKG:  EKG is  ordered today.  The ekg ordered today demonstrates sinus rhythm 59 bpm first-degree AV block right bundle branch block QRS duration 134, PR interval 226  Recent Labs: 01/28/2021: BUN 15; Creatinine, Ser 0.76; Hemoglobin 12.4; Platelets 201; Potassium 3.8; Sodium 133  Recent Lipid Panel No results found for: CHOL, TRIG, HDL, CHOLHDL, VLDL, LDLCALC, LDLDIRECT   Risk Assessment/Calculations:      Physical Exam:    VS:  BP 110/80 (BP Location: Left Arm, Patient Position: Sitting, Cuff Size: Normal)   Pulse (!) 59   Ht 5\' 6"  (1.676 m)   Wt 133 lb (60.3 kg)   SpO2 96%   BMI 21.47 kg/m     Wt Readings from Last 3 Encounters:  02/20/21 133 lb (60.3 kg)     GEN:  Well nourished, well developed in no acute distress HEENT: Normal NECK: No JVD; No carotid bruits LYMPHATICS: No lymphadenopathy CARDIAC: RRR, no murmurs, rubs, gallops RESPIRATORY:  Clear to auscultation without rales, wheezing or rhonchi  ABDOMEN: Soft, non-tender, non-distended MUSCULOSKELETAL:  No edema; No deformity  SKIN: Warm and dry NEUROLOGIC:  Alert and oriented x 3 PSYCHIATRIC:  Normal affect   ASSESSMENT:    1. Syncope and collapse   2. Precordial pain   3. Pre-procedure lab exam    PLAN:    In order of problems listed above:  Chest discomfort -Noting this when walking up hills.  Could be anginal equivalent.  We will check a coronary CT scan.  I have  removed contrast allergy from her allergy list.  This was in 1986 she had a knee arthrogram.  It caused localized knee swelling.  She did not have any systemic symptoms, no rashes no anaphylaxis.  When offered her prednisone just in case, she did not wish to use this medication, she is sensitive to it.  I would like to go ahead and proceed with CT with contrast. -We will check echocardiogram to ensure proper structure and function.  Syncope -Agree that this was vasovagal in the setting of wrist pain.  If these episodes return, we will have low threshold for utilizing  Zio patch monitor.  Continue with hydration, salt liberalization.  Abnormal EKG/first-degree AV block/right bundle branch block -New since 2002.  Checking echocardiogram.  CT scan coronary.  Migraine -She was told that she may have a PFO.  We will do a bubble study with her echocardiogram. -She is sensitive to food preservatives.  Mitral valve prolapse -Previously described in 2002 echocardiogram personally reviewed report.  Palpitations -Agree that these are likely PVCs.  Can result in a pounding thawed type sensation.  If these continue, we will have low threshold to proceed with monitoring. 79-month follow-up.     Medication Adjustments/Labs and Tests Ordered: Current medicines are reviewed at length with the patient today.  Concerns regarding medicines are outlined above.  Orders Placed This Encounter  Procedures  . CT CORONARY MORPH W/CTA COR W/SCORE W/CA W/CM &/OR WO/CM  . Basic metabolic panel  . EKG 12-Lead  . ECHOCARDIOGRAM COMPLETE BUBBLE STUDY   Meds ordered this encounter  Medications  . metoprolol tartrate (LOPRESSOR) 25 MG tablet    Sig: Take 1 tablet (25 mg total) by mouth once for 1 dose. Patient to take 1 tablet 2 hours prior to CT    Dispense:  1 tablet    Refill:  0    Patient Instructions  Medication Instructions:  Your physician recommends that you continue on your current medications as  directed. Please refer to the Current Medication list given to you today. *If you need a refill on your cardiac medications before your next appointment, please call your pharmacy*   Lab Work: None If you have labs (blood work) drawn today and your tests are completely normal, you will receive your results only by: Marland Kitchen MyChart Message (if you have MyChart) OR . A paper copy in the mail If you have any lab test that is abnormal or we need to change your treatment, we will call you to review the results.   Testing/Procedures: Your physician has requested that you have an echocardiogram. Echocardiography is a painless test that uses sound waves to create images of your heart. It provides your doctor with information about the size and shape of your heart and how well your heart's chambers and valves are working. This procedure takes approximately one hour. There are no restrictions for this procedure.   Your cardiac CT will be scheduled at   [facility] [place], [place] [address] [phone]  Please arrive at the [facility] main entrance (entrance A) of [facility] 30 minutes prior to test start time. Proceed to the [facility] Radiology Department (first floor) to check-in and test prep.   Please follow these instructions carefully (unless otherwise directed):  Patient will need a BMET a few days before ECHO  On the Night Before the Test: . Be sure to Drink plenty of water. . Do not consume any caffeinated/decaffeinated beverages or chocolate 12 hours prior to your test. . Do not take any antihistamines 12 hours prior to your test.   On the Day of the Test: . Drink plenty of water until 1 hour prior to the test. . Do not eat any food 4 hours prior to the test. . You may take your regular medications prior to the test.  . Take metoprolol (Lopressor) two hours prior to test. . HOLD Furosemide/Hydrochlorothiazide morning of the test. . FEMALES-  please wear underwire-free bra if available       After the Test: . Drink plenty of water. . After receiving  IV contrast, you may experience a mild flushed feeling. This is normal. . On occasion, you may experience a mild rash up to 24 hours after the test. This is not dangerous. If this occurs, you can take Benadryl 25 mg and increase your fluid intake. . If you experience trouble breathing, this can be serious. If it is severe call 911 IMMEDIATELY. If it is mild, please call our office. . If you take any of these medications: Glipizide/Metformin, Avandament, Glucavance, please do not take 48 hours after completing test unless otherwise instructed.   Once we have confirmed authorization from your insurance company, we will call you to set up a date and time for your test. Based on how quickly your insurance processes prior authorizations requests, please allow up to 4 weeks to be contacted for scheduling your Cardiac CT appointment. Be advised that routine Cardiac CT appointments could be scheduled as many as 8 weeks after your provider has ordered it.  For non-scheduling related questions, please contact the cardiac imaging nurse navigator should you have any questions/concerns: Marchia Bond, Cardiac Imaging Nurse Navigator Gordy Clement, Cardiac Imaging Nurse Navigator [facility] Direct Office Dial: [phone]   For scheduling needs, including cancellations and rescheduling, please call [place], [phone].     Follow-Up: At [facility], you and your health needs are our priority.  As part of our continuing mission to provide you with exceptional heart care, we have created designated Provider Care Teams.  These Care Teams include your primary Cardiologist (physician) and Advanced Practice Providers (APPs -  Physician Assistants and Nurse Practitioners) who all work together to provide you with the care you need, when you need it.     Your next  appointment:   6 month(s)  The format for your next appointment:   In Person  Provider:   You may see Candee Furbish, MD or one of the following Advanced Practice Providers on your designated Care Team:    Kathyrn Drown, NP         Signed, Candee Furbish, MD  02/20/2021 10:49 AM    [place]

## 2021-02-20 NOTE — Patient Instructions (Signed)
Medication Instructions:  Your physician recommends that you continue on your current medications as directed. Please refer to the Current Medication list given to you today. *If you need a refill on your cardiac medications before your next appointment, please call your pharmacy*   Lab Work: None If you have labs (blood work) drawn today and your tests are completely normal, you will receive your results only by: Marland Kitchen MyChart Message (if you have MyChart) OR . A paper copy in the mail If you have any lab test that is abnormal or we need to change your treatment, we will call you to review the results.   Testing/Procedures: Your physician has requested that you have an echocardiogram. Echocardiography is a painless test that uses sound waves to create images of your heart. It provides your doctor with information about the size and shape of your heart and how well your heart's chambers and valves are working. This procedure takes approximately one hour. There are no restrictions for this procedure.   Your cardiac CT will be scheduled at   [facility] [place], [place] [address] [phone]  Please arrive at the [facility] main entrance (entrance A) of [facility] 30 minutes prior to test start time. Proceed to the [facility] Radiology Department (first floor) to check-in and test prep.   Please follow these instructions carefully (unless otherwise directed):  Patient will need a BMET a few days before ECHO  On the Night Before the Test: . Be sure to Drink plenty of water. . Do not consume any caffeinated/decaffeinated beverages or chocolate 12 hours prior to your test. . Do not take any antihistamines 12 hours prior to your test.   On the Day of the Test: . Drink plenty of water until 1 hour prior to the test. . Do not eat any food 4 hours prior to the test. . You may take your regular medications prior to the test.  . Take metoprolol  (Lopressor) two hours prior to test. . HOLD Furosemide/Hydrochlorothiazide morning of the test. . FEMALES- please wear underwire-free bra if available       After the Test: . Drink plenty of water. . After receiving IV contrast, you may experience a mild flushed feeling. This is normal. . On occasion, you may experience a mild rash up to 24 hours after the test. This is not dangerous. If this occurs, you can take Benadryl 25 mg and increase your fluid intake. . If you experience trouble breathing, this can be serious. If it is severe call 911 IMMEDIATELY. If it is mild, please call our office. . If you take any of these medications: Glipizide/Metformin, Avandament, Glucavance, please do not take 48 hours after completing test unless otherwise instructed.   Once we have confirmed authorization from your insurance company, we will call you to set up a date and time for your test. Based on how quickly your insurance processes prior authorizations requests, please allow up to 4 weeks to be contacted for scheduling your Cardiac CT appointment. Be advised that routine Cardiac CT appointments could be scheduled as many as 8 weeks after your provider has ordered it.  For non-scheduling related questions, please contact the cardiac imaging nurse navigator should you have any questions/concerns: Marchia Bond, Cardiac Imaging Nurse Navigator Gordy Clement, Cardiac Imaging Nurse Navigator [facility] Direct Office Dial: [phone]   For scheduling needs, including cancellations and rescheduling, please call [place], [phone].  Follow-Up: At [facility], you and your health needs are our priority.  As part of our continuing mission to provide you with exceptional heart care, we have created designated Provider Care Teams.  These Care Teams include your primary Cardiologist (physician) and Advanced Practice Providers (APPs -  Physician Assistants and Nurse  Practitioners) who all work together to provide you with the care you need, when you need it.     Your next appointment:   6 month(s)  The format for your next appointment:   In Person  Provider:   You may see Candee Furbish, MD or one of the following Advanced Practice Providers on your designated Care Team:    Kathyrn Drown, NP

## 2021-03-03 ENCOUNTER — Other Ambulatory Visit: Payer: Self-pay

## 2021-03-03 ENCOUNTER — Telehealth: Payer: Self-pay

## 2021-03-03 ENCOUNTER — Telehealth (HOSPITAL_COMMUNITY): Payer: Self-pay | Admitting: *Deleted

## 2021-03-03 DIAGNOSIS — Z01812 Encounter for preprocedural laboratory examination: Secondary | ICD-10-CM

## 2021-03-03 NOTE — Telephone Encounter (Signed)
Reaching out to patient to offer assistance regarding upcoming cardiac imaging study; pt verbalizes understanding of appt date/time, parking situation and where to check in, pre-test NPO status and medications ordered, and verified current allergies; name and call back number provided for further questions should they arise  Gordy Clement RN Navigator Cardiac Imaging [facility] [phone] office [phone] cell  Pt made aware to get lab prior to cardiac CT scan.

## 2021-03-03 NOTE — Telephone Encounter (Signed)
RN called patient and asked what time tomorrow would work for a BMET; pt stated she would come at 7:30am. RN scheduled the appt for 03/04/2021.   Manuela Schwartz RN

## 2021-03-03 NOTE — Telephone Encounter (Signed)
-----   Message from Eli Hose, RN sent at 03/03/2021 11:24 AM EDT ----- Alicia Green,  Can you schedule the patient for labs for her CCTA this Wednesday?  Thanks, Kerin Ransom

## 2021-03-04 ENCOUNTER — Other Ambulatory Visit: Payer: Self-pay

## 2021-03-04 ENCOUNTER — Telehealth (HOSPITAL_COMMUNITY): Payer: Self-pay | Admitting: *Deleted

## 2021-03-04 ENCOUNTER — Other Ambulatory Visit: Payer: Medicare Other | Admitting: *Deleted

## 2021-03-04 DIAGNOSIS — Z01812 Encounter for preprocedural laboratory examination: Secondary | ICD-10-CM | POA: Diagnosis not present

## 2021-03-04 LAB — BASIC METABOLIC PANEL
BUN/Creatinine Ratio: 23 (ref 12–28)
BUN: 19 mg/dL (ref 8–27)
CO2: 31 mmol/L — ABNORMAL HIGH (ref 20–29)
Calcium: 9.2 mg/dL (ref 8.7–10.3)
Chloride: 102 mmol/L (ref 96–106)
Creatinine, Ser: 0.83 mg/dL (ref 0.57–1.00)
Glucose: 80 mg/dL (ref 65–99)
Potassium: 4.4 mmol/L (ref 3.5–5.2)
Sodium: 138 mmol/L (ref 134–144)
eGFR: 78 mL/min/{1.73_m2} (ref >59)

## 2021-03-04 NOTE — Telephone Encounter (Signed)
Returning pt's call regarding concern for gluten in medications.  Pt informed that we could IV metoprolol if her HR is above 65 and that the SL nitroglycerin does not contain gluten.  Pt appreciative with call.   Eli Hose  RN navigator, Cardiac Imaging Office: [phone]

## 2021-03-05 ENCOUNTER — Ambulatory Visit (HOSPITAL_COMMUNITY)
Admission: RE | Admit: 2021-03-05 | Discharge: 2021-03-05 | Disposition: A | Discharge status: Home / Self Care | Payer: Medicare Other | Source: Ambulatory Visit | Attending: Cardiology | Admitting: Cardiology

## 2021-03-05 ENCOUNTER — Encounter (HOSPITAL_COMMUNITY): Payer: Self-pay

## 2021-03-05 DIAGNOSIS — R072 Precordial pain: Secondary | ICD-10-CM | POA: Diagnosis not present

## 2021-03-05 DIAGNOSIS — R55 Syncope and collapse: Secondary | ICD-10-CM | POA: Insufficient documentation

## 2021-03-05 MED ORDER — METOPROLOL TARTRATE 5 MG/5ML IV SOLN: 5.0000 mg | Freq: Once | INTRAVENOUS | Status: AC

## 2021-03-05 MED ORDER — NITROGLYCERIN 0.4 MG SL SUBL
0.8000 mg | SUBLINGUAL_TABLET | Freq: Once | SUBLINGUAL | Status: AC
  Administered 2021-03-05: 0.8 mg via SUBLINGUAL

## 2021-03-05 MED ORDER — IOHEXOL 350 MG/ML SOLN
80.0000 mL | Freq: Once | INTRAVENOUS | Status: AC | PRN
  Administered 2021-03-05: 80 mL via INTRAVENOUS

## 2021-03-05 MED ORDER — NITROGLYCERIN 0.4 MG SL SUBL
SUBLINGUAL_TABLET | SUBLINGUAL | Status: AC
  Filled 2021-03-05: qty 2

## 2021-03-05 MED ORDER — METOPROLOL TARTRATE 5 MG/5ML IV SOLN
INTRAVENOUS | Status: AC
  Administered 2021-03-05: 5 mg via INTRAVENOUS
  Filled 2021-03-05: qty 5

## 2021-03-11 ENCOUNTER — Ambulatory Visit (HOSPITAL_COMMUNITY): Payer: Medicare Other | Attending: Cardiovascular Disease

## 2021-03-11 ENCOUNTER — Other Ambulatory Visit: Payer: Self-pay

## 2021-03-11 DIAGNOSIS — R55 Syncope and collapse: Secondary | ICD-10-CM

## 2021-03-11 DIAGNOSIS — R072 Precordial pain: Secondary | ICD-10-CM | POA: Diagnosis not present

## 2021-03-11 LAB — ECHOCARDIOGRAM COMPLETE BUBBLE STUDY
Area-P 1/2: 3.72 cm2
S' Lateral: 2.7 cm

## 2021-03-11 MED ORDER — SODIUM CHLORIDE 0.9% FLUSH
32.0000 mL | INTRAVENOUS | Status: AC | PRN
Start: 2021-03-11 — End: ?
  Administered 2021-03-11: 32 mL

## 2021-04-01 DIAGNOSIS — Z23 Encounter for immunization: Secondary | ICD-10-CM | POA: Diagnosis not present

## 2021-04-14 ENCOUNTER — Ambulatory Visit
Admission: RE | Admit: 2021-04-14 | Discharge: 2021-04-14 | Disposition: A | Discharge status: Home / Self Care | Payer: Medicare Other | Source: Ambulatory Visit | Attending: Family Medicine | Admitting: Family Medicine

## 2021-04-14 ENCOUNTER — Other Ambulatory Visit: Payer: Self-pay

## 2021-04-14 ENCOUNTER — Ambulatory Visit: Payer: Medicare Other

## 2021-04-14 DIAGNOSIS — M8588 Other specified disorders of bone density and structure, other site: Secondary | ICD-10-CM

## 2021-04-14 DIAGNOSIS — M85851 Other specified disorders of bone density and structure, right thigh: Secondary | ICD-10-CM | POA: Diagnosis not present

## 2021-04-14 DIAGNOSIS — Z780 Asymptomatic menopausal state: Secondary | ICD-10-CM | POA: Diagnosis not present

## 2021-04-14 DIAGNOSIS — Z1231 Encounter for screening mammogram for malignant neoplasm of breast: Secondary | ICD-10-CM | POA: Diagnosis not present

## 2021-04-14 DIAGNOSIS — M810 Age-related osteoporosis without current pathological fracture: Secondary | ICD-10-CM | POA: Diagnosis not present

## 2021-04-23 DIAGNOSIS — I8311 Varicose veins of right lower extremity with inflammation: Secondary | ICD-10-CM | POA: Diagnosis not present

## 2021-04-23 DIAGNOSIS — I83813 Varicose veins of bilateral lower extremities with pain: Secondary | ICD-10-CM | POA: Diagnosis not present

## 2021-04-23 DIAGNOSIS — I8312 Varicose veins of left lower extremity with inflammation: Secondary | ICD-10-CM | POA: Diagnosis not present

## 2021-05-21 HISTORY — PX: WRIST SURGERY: SHX841

## 2021-05-29 DIAGNOSIS — M8588 Other specified disorders of bone density and structure, other site: Secondary | ICD-10-CM | POA: Diagnosis not present

## 2021-05-29 DIAGNOSIS — M810 Age-related osteoporosis without current pathological fracture: Secondary | ICD-10-CM | POA: Diagnosis not present

## 2021-06-03 DIAGNOSIS — M25532 Pain in left wrist: Secondary | ICD-10-CM | POA: Diagnosis not present

## 2021-06-04 DIAGNOSIS — S52572A Other intraarticular fracture of lower end of left radius, initial encounter for closed fracture: Secondary | ICD-10-CM | POA: Diagnosis not present

## 2021-06-06 DIAGNOSIS — G8918 Other acute postprocedural pain: Secondary | ICD-10-CM | POA: Diagnosis not present

## 2021-06-06 DIAGNOSIS — Y999 Unspecified external cause status: Secondary | ICD-10-CM | POA: Diagnosis not present

## 2021-06-06 DIAGNOSIS — S52572A Other intraarticular fracture of lower end of left radius, initial encounter for closed fracture: Secondary | ICD-10-CM | POA: Diagnosis not present

## 2021-06-06 DIAGNOSIS — X58XXXA Exposure to other specified factors, initial encounter: Secondary | ICD-10-CM | POA: Diagnosis not present

## 2021-06-12 DIAGNOSIS — Z4789 Encounter for other orthopedic aftercare: Secondary | ICD-10-CM | POA: Diagnosis not present

## 2021-06-12 DIAGNOSIS — M25632 Stiffness of left wrist, not elsewhere classified: Secondary | ICD-10-CM | POA: Diagnosis not present

## 2021-06-12 DIAGNOSIS — Z789 Other specified health status: Secondary | ICD-10-CM | POA: Diagnosis not present

## 2021-06-12 DIAGNOSIS — S52572A Other intraarticular fracture of lower end of left radius, initial encounter for closed fracture: Secondary | ICD-10-CM | POA: Diagnosis not present

## 2021-06-30 DIAGNOSIS — Z20822 Contact with and (suspected) exposure to covid-19: Secondary | ICD-10-CM | POA: Diagnosis not present

## 2021-07-01 DIAGNOSIS — R110 Nausea: Secondary | ICD-10-CM | POA: Diagnosis not present

## 2021-07-10 DIAGNOSIS — S52572D Other intraarticular fracture of lower end of left radius, subsequent encounter for closed fracture with routine healing: Secondary | ICD-10-CM | POA: Diagnosis not present

## 2021-07-15 DIAGNOSIS — S90852A Superficial foreign body, left foot, initial encounter: Secondary | ICD-10-CM | POA: Diagnosis not present

## 2021-07-15 DIAGNOSIS — S91332A Puncture wound without foreign body, left foot, initial encounter: Secondary | ICD-10-CM | POA: Diagnosis not present

## 2021-07-29 DIAGNOSIS — R208 Other disturbances of skin sensation: Secondary | ICD-10-CM | POA: Diagnosis not present

## 2021-07-29 DIAGNOSIS — S91332A Puncture wound without foreign body, left foot, initial encounter: Secondary | ICD-10-CM | POA: Diagnosis not present

## 2021-07-31 DIAGNOSIS — Z789 Other specified health status: Secondary | ICD-10-CM | POA: Diagnosis not present

## 2021-07-31 DIAGNOSIS — S52572A Other intraarticular fracture of lower end of left radius, initial encounter for closed fracture: Secondary | ICD-10-CM | POA: Diagnosis not present

## 2021-07-31 DIAGNOSIS — M25632 Stiffness of left wrist, not elsewhere classified: Secondary | ICD-10-CM | POA: Diagnosis not present

## 2021-07-31 DIAGNOSIS — S52572D Other intraarticular fracture of lower end of left radius, subsequent encounter for closed fracture with routine healing: Secondary | ICD-10-CM | POA: Diagnosis not present

## 2021-08-06 DIAGNOSIS — M25632 Stiffness of left wrist, not elsewhere classified: Secondary | ICD-10-CM | POA: Diagnosis not present

## 2021-08-06 DIAGNOSIS — Z789 Other specified health status: Secondary | ICD-10-CM | POA: Diagnosis not present

## 2021-08-15 DIAGNOSIS — Z1389 Encounter for screening for other disorder: Secondary | ICD-10-CM | POA: Diagnosis not present

## 2021-08-15 DIAGNOSIS — Z0000 Encounter for general adult medical examination without abnormal findings: Secondary | ICD-10-CM | POA: Diagnosis not present

## 2021-08-15 DIAGNOSIS — Z13220 Encounter for screening for lipoid disorders: Secondary | ICD-10-CM | POA: Diagnosis not present

## 2021-08-15 DIAGNOSIS — M810 Age-related osteoporosis without current pathological fracture: Secondary | ICD-10-CM | POA: Diagnosis not present

## 2021-08-18 DIAGNOSIS — I8312 Varicose veins of left lower extremity with inflammation: Secondary | ICD-10-CM | POA: Diagnosis not present

## 2021-08-18 DIAGNOSIS — I83813 Varicose veins of bilateral lower extremities with pain: Secondary | ICD-10-CM | POA: Diagnosis not present

## 2021-08-18 DIAGNOSIS — I8311 Varicose veins of right lower extremity with inflammation: Secondary | ICD-10-CM | POA: Diagnosis not present

## 2021-08-19 DIAGNOSIS — I8311 Varicose veins of right lower extremity with inflammation: Secondary | ICD-10-CM | POA: Diagnosis not present

## 2021-08-19 DIAGNOSIS — I8312 Varicose veins of left lower extremity with inflammation: Secondary | ICD-10-CM | POA: Diagnosis not present

## 2021-08-20 DIAGNOSIS — M25632 Stiffness of left wrist, not elsewhere classified: Secondary | ICD-10-CM | POA: Diagnosis not present

## 2021-08-20 DIAGNOSIS — Z789 Other specified health status: Secondary | ICD-10-CM | POA: Diagnosis not present

## 2021-08-22 ENCOUNTER — Ambulatory Visit: Payer: Medicare Other | Admitting: Cardiology

## 2021-08-22 DIAGNOSIS — Z23 Encounter for immunization: Secondary | ICD-10-CM | POA: Diagnosis not present

## 2021-08-26 DIAGNOSIS — Z23 Encounter for immunization: Secondary | ICD-10-CM | POA: Diagnosis not present

## 2021-08-28 DIAGNOSIS — M25632 Stiffness of left wrist, not elsewhere classified: Secondary | ICD-10-CM | POA: Diagnosis not present

## 2021-08-28 DIAGNOSIS — Z789 Other specified health status: Secondary | ICD-10-CM | POA: Diagnosis not present

## 2021-08-28 DIAGNOSIS — S52572D Other intraarticular fracture of lower end of left radius, subsequent encounter for closed fracture with routine healing: Secondary | ICD-10-CM | POA: Diagnosis not present

## 2021-08-28 DIAGNOSIS — R29898 Other symptoms and signs involving the musculoskeletal system: Secondary | ICD-10-CM | POA: Diagnosis not present

## 2021-10-07 DIAGNOSIS — D2361 Other benign neoplasm of skin of right upper limb, including shoulder: Secondary | ICD-10-CM | POA: Diagnosis not present

## 2021-10-07 DIAGNOSIS — L819 Disorder of pigmentation, unspecified: Secondary | ICD-10-CM | POA: Diagnosis not present

## 2021-10-07 DIAGNOSIS — L821 Other seborrheic keratosis: Secondary | ICD-10-CM | POA: Diagnosis not present

## 2021-10-07 DIAGNOSIS — D1801 Hemangioma of skin and subcutaneous tissue: Secondary | ICD-10-CM | POA: Diagnosis not present

## 2021-10-21 IMAGING — CT CT HEART MORP W/ CTA COR W/ SCORE W/ CA W/CM &/OR W/O CM
4 of 7 series · 8 of 20 positions shown, 9 images · IV contrast (APPLIED)
Comparison: None.
COMPARISON: None.

Addendum:
EXAM:
OVER-READ INTERPRETATION  CT CHEST

The following report is an over-read performed by radiologist Dr.
Brothers Browne [REDACTED] on 03/05/2021. This over-read
does not include interpretation of cardiac or coronary anatomy or
pathology. The coronary calcium score/coronary CTA interpretation by
the cardiologist is attached.
TECHNIQUE: The patient was scanned on a Phillips Force scanner.

[Series 6: best diast 71 % · axial · 0.39mm/px · z∈[+1200,+1248]mm · 2 of 361 slices shown]
[im 121/361  vessel]
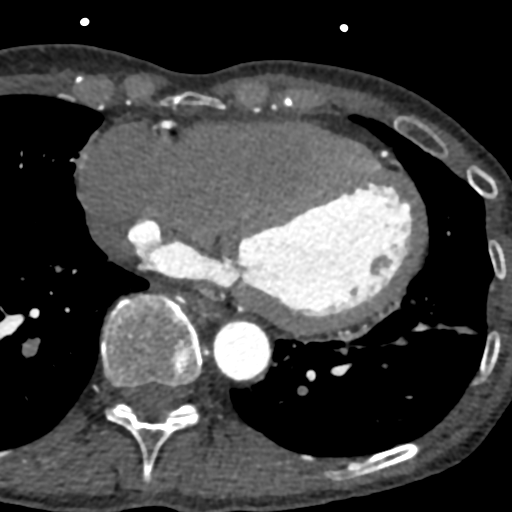
[im 241/361  vessel]
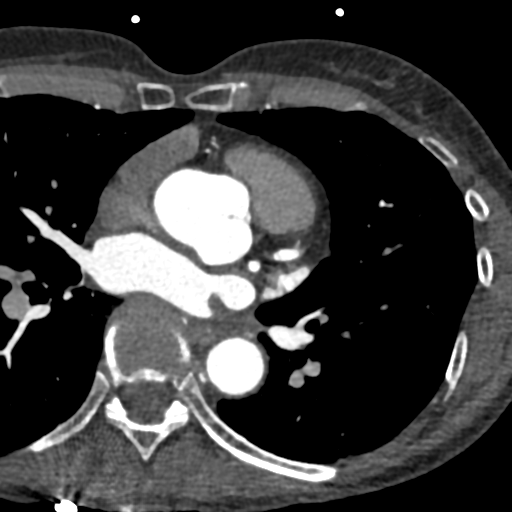

[Series 7: best syst · axial · 0.39mm/px · z∈[+1200,+1248]mm · 2 of 361 slices shown, 3 images]
[im 121/361  vessel]
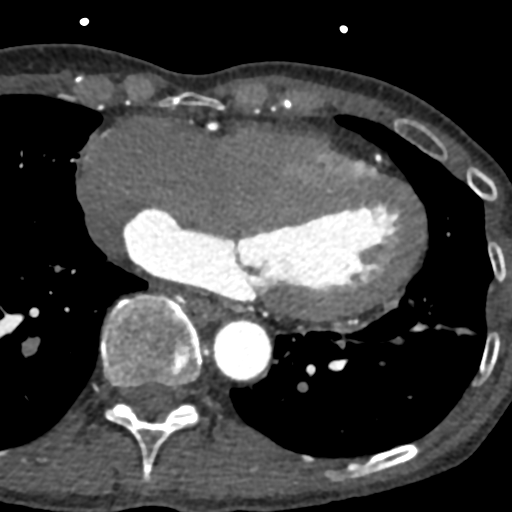
[im 121/361  lung]
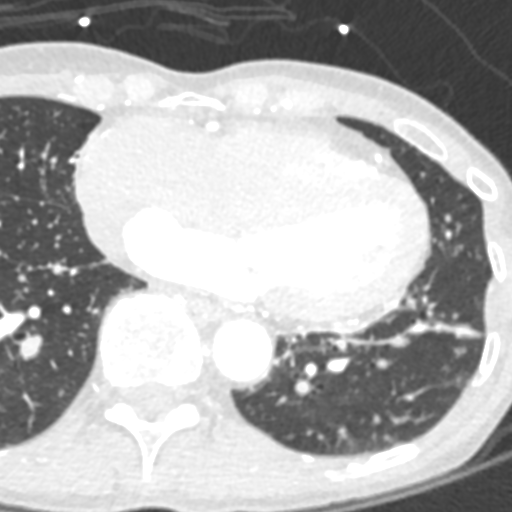
[im 241/361  vessel]
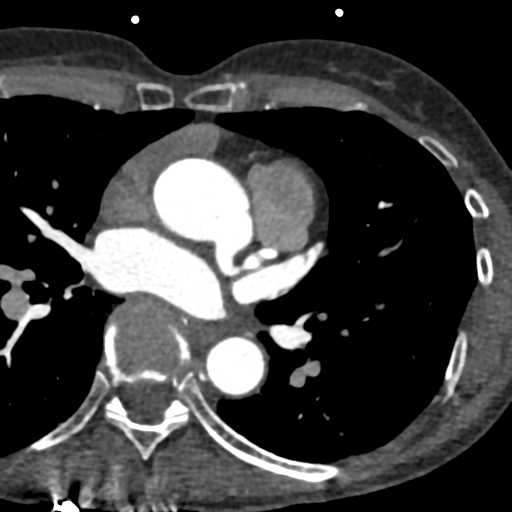

[Series 8: ts diast sharp 71 % · axial · 0.39mm/px · z∈[+1200,+1248]mm · 2 of 361 slices shown]
[im 121/361  lung]
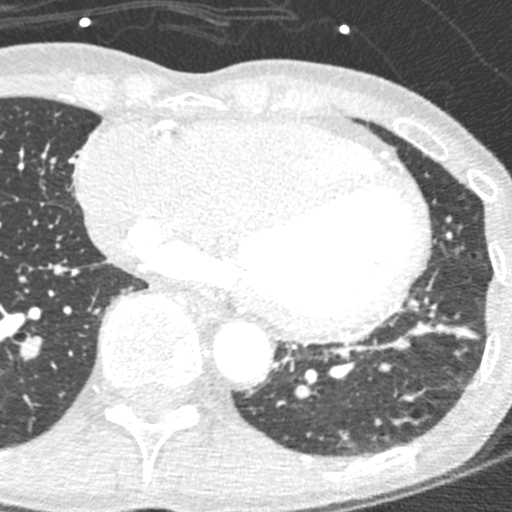
[im 241/361  lung]
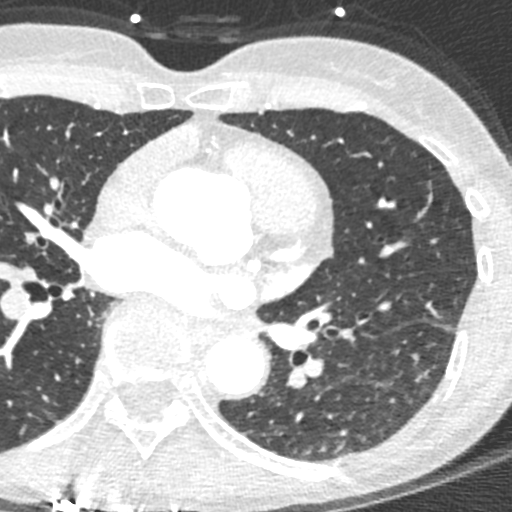

[Series 9: ts syst sharp · axial · 0.39mm/px · z∈[+1200,+1248]mm · 2 of 361 slices shown]
[im 121/361  lung]
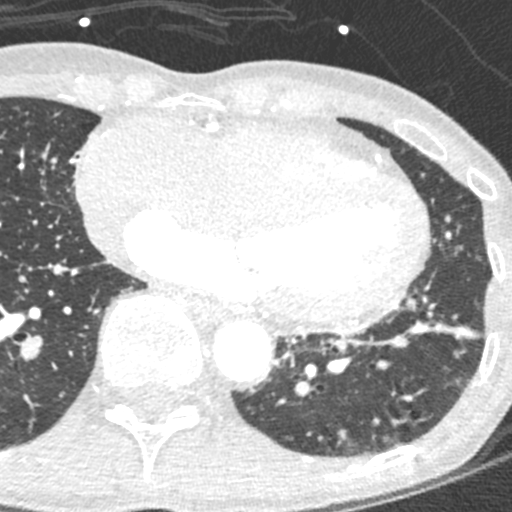
[im 241/361  lung]
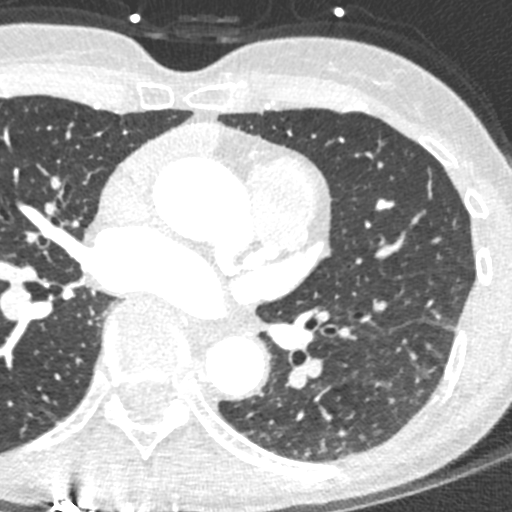

[8 of 20 positions shown; findings below may reference images not displayed]

FINDINGS: Vascular: No evidence for an aneurysm in the visualized thoracic
aorta. Minimal atherosclerotic disease in the descending thoracic
aorta.

Mediastinum/Nodes: Negative

Lungs/Pleura: No significant pleural effusions. Bandlike density in
the left lower lobe is suggestive for atelectasis or scarring.
Subtle posterior peripheral densities in both lower lobes probably
represent areas of atelectasis or postinflammatory change. No
significant airspace disease or consolidation in the lungs.

Upper Abdomen: Limited evaluation.

Musculoskeletal: No acute abnormality.
IMPRESSION: No acute abnormalities involving the extracardiac structures.

Aortic Atherosclerosis (EKCIX-5E1.1).

EXAM:
Cardiac/Coronary  CT
FINDINGS: A 120 kV prospective scan was triggered in the descending thoracic
aorta at 111 HU's. Axial non-contrast 3 mm slices were carried out
through the heart. The data set was analyzed on a dedicated work
station and scored using the Agatson method. Gantry rotation speed
was 250 msecs and collimation was .6 mm. No beta blockade and 0.8 mg
of sl NTG was given. The 3D data set was reconstructed in 5%
intervals of the 67-82 % of the R-R cycle. Diastolic phases were
analyzed on a dedicated work station using MPR, MIP and VRT modes.
The patient received 80 cc of contrast.

Aorta: Normal size. Scattered calcifications in the descending
thoracic aorta. No dissection.

Aortic Valve:  Trileaflet.  No calcifications.

Coronary Arteries:  Normal coronary origin.  Right dominance.

RCA is a large dominant artery that gives rise to PDA and PLVB.
There is no plaque.

Left main is a large artery that gives rise to LAD and LCX arteries.
There is no plaque.

LAD is a large vessel that gives rise to a large diagonal There is
no plaque.

LCX is a non-dominant artery that gives rise to one large
bifurcating OM1 branch. There is no plaque.

Other findings:

Normal pulmonary vein drainage into the left atrium.

Normal let atrial appendage without a thrombus.

Normal size of the pulmonary artery.
IMPRESSION: 1. Coronary calcium score of 0. This was 0 percentile for age and
sex matched control.

2.  Normal coronary origin with right dominance.

3.  No evidence of CAD.  CAD RADS 0.

4.  Consider non atherosclerotic causes of chest pain.

Khehlah Chantell

*** End of Addendum ***
EXAM:
OVER-READ INTERPRETATION  CT CHEST

The following report is an over-read performed by radiologist Dr.
Brothers Browne [REDACTED] on 03/05/2021. This over-read
does not include interpretation of cardiac or coronary anatomy or
pathology. The coronary calcium score/coronary CTA interpretation by
the cardiologist is attached.
FINDINGS: Vascular: No evidence for an aneurysm in the visualized thoracic
aorta. Minimal atherosclerotic disease in the descending thoracic
aorta.

Mediastinum/Nodes: Negative

Lungs/Pleura: No significant pleural effusions. Bandlike density in
the left lower lobe is suggestive for atelectasis or scarring.
Subtle posterior peripheral densities in both lower lobes probably
represent areas of atelectasis or postinflammatory change. No
significant airspace disease or consolidation in the lungs.

Upper Abdomen: Limited evaluation.

Musculoskeletal: No acute abnormality.
IMPRESSION: No acute abnormalities involving the extracardiac structures.

Aortic Atherosclerosis (EKCIX-5E1.1).

## 2021-11-02 DIAGNOSIS — M2392 Unspecified internal derangement of left knee: Secondary | ICD-10-CM | POA: Diagnosis not present

## 2021-11-10 ENCOUNTER — Telehealth: Payer: Self-pay | Admitting: Orthopedic Surgery

## 2021-11-10 NOTE — Telephone Encounter (Signed)
Dr Marlou Sa wanted patient called to move up her appointment from 12/11/21 due to the fact she may have a MCL & ACL tear. I called patient and she states she's in [place] and will not be back to [place] until around 12/11/21. She also states she's trying to see a Orthopedic provider while in [place] to get a MRI.   I told patient if and when she returns to [facility], to call the office (ask for Cassandra or Ander Purpura F.) to get appointment moved up ASAP.

## 2021-11-12 ENCOUNTER — Encounter: Payer: Self-pay | Admitting: Orthopedic Surgery

## 2021-11-12 DIAGNOSIS — S83512A Sprain of anterior cruciate ligament of left knee, initial encounter: Secondary | ICD-10-CM | POA: Diagnosis not present

## 2021-11-12 DIAGNOSIS — S8992XA Unspecified injury of left lower leg, initial encounter: Secondary | ICD-10-CM

## 2021-11-12 DIAGNOSIS — M1712 Unilateral primary osteoarthritis, left knee: Secondary | ICD-10-CM | POA: Diagnosis not present

## 2021-11-25 ENCOUNTER — Encounter: Payer: Self-pay | Admitting: Orthopedic Surgery

## 2021-12-02 ENCOUNTER — Ambulatory Visit
Admission: RE | Admit: 2021-12-02 | Discharge: 2021-12-02 | Disposition: A | Discharge status: Home / Self Care | Payer: Medicare Other | Source: Ambulatory Visit | Attending: Orthopedic Surgery | Admitting: Orthopedic Surgery

## 2021-12-02 ENCOUNTER — Other Ambulatory Visit: Payer: Self-pay

## 2021-12-02 ENCOUNTER — Encounter: Payer: Self-pay | Admitting: Orthopedic Surgery

## 2021-12-02 DIAGNOSIS — S83412A Sprain of medial collateral ligament of left knee, initial encounter: Secondary | ICD-10-CM | POA: Diagnosis not present

## 2021-12-02 DIAGNOSIS — M25462 Effusion, left knee: Secondary | ICD-10-CM | POA: Diagnosis not present

## 2021-12-02 DIAGNOSIS — S8992XA Unspecified injury of left lower leg, initial encounter: Secondary | ICD-10-CM

## 2021-12-02 DIAGNOSIS — S82142A Displaced bicondylar fracture of left tibia, initial encounter for closed fracture: Secondary | ICD-10-CM | POA: Diagnosis not present

## 2021-12-02 DIAGNOSIS — S83242A Other tear of medial meniscus, current injury, left knee, initial encounter: Secondary | ICD-10-CM | POA: Diagnosis not present

## 2021-12-03 ENCOUNTER — Ambulatory Visit (INDEPENDENT_AMBULATORY_CARE_PROVIDER_SITE_OTHER): Payer: Medicare Other | Admitting: Orthopedic Surgery

## 2021-12-03 DIAGNOSIS — S83207A Unspecified tear of unspecified meniscus, current injury, left knee, initial encounter: Secondary | ICD-10-CM

## 2021-12-03 DIAGNOSIS — S83512A Sprain of anterior cruciate ligament of left knee, initial encounter: Secondary | ICD-10-CM

## 2021-12-05 ENCOUNTER — Other Ambulatory Visit: Payer: Medicare Other

## 2021-12-06 ENCOUNTER — Encounter: Payer: Self-pay | Admitting: Orthopedic Surgery

## 2021-12-06 NOTE — Progress Notes (Signed)
Office Visit Note   Patient: Alicia Green           Date of Birth: 08/13/1956           MRN: [id number] Visit Date: 12/03/2021 Requested by: Cari Caraway, [place],  [place] [address] PCP: Cari Caraway, MD  Subjective: Chief Complaint  Patient presents with   Other     Scan review    HPI: Kendalynn is a 65 year old patient with left knee pain.  She had a skiing injury 1 month ago when she was traveling across the country with her husband.  Had prior surgery 1967.  She has achieved better range of motion with diminished swelling.  However it is still painful for her to walk.  She likes to do skiing and racquetball.  She does have a history of osteopenia.  She is a physical therapist.  Would like to remain active.  Does describe instability symptoms in the knee.              ROS: All systems reviewed are negative as they relate to the chief complaint within the history of present illness.  Patient denies  fevers or chills.   Assessment & Plan: Visit Diagnoses:  1. Tears of meniscus and ACL of left knee, initial encounter     Plan: Impression is active 65 year old patient with left knee pain and instability.  MRI scan is reviewed and it does show ACL tear with possible undersurface tear of the posterior horn of that medial meniscus.  Although the patient is 78 she does seem much younger in terms of her physical activity and demands with cutting and pivoting sports that she plans to continue.  We discussed operative and nonoperative treatment options.  In general operative treatment would give her best chance at a stable knee.  We discussed allograft versus autograft as well as donor site morbidity and return to function.  In her case I think and injected bone patella tendon bone allograft is her best option.  Could inject BMA/PRP into the allograft tendon to allow for quicker revascularization and incorporation.  Prerehabilitation prescription written today.  Return in  3 weeks at which time we will likely schedule her for surgery.  She does not quite have enough flexion yet to schedule at this time.  I do think with another 2 to 3 weeks of pretty rehabilitation she would be an optimal condition for surgery.  Follow-Up Instructions: No follow-ups on file.   Orders:  No orders of the defined types were placed in this encounter.  No orders of the defined types were placed in this encounter.     Procedures: No procedures performed   Clinical Data: No additional findings.  Objective: Vital Signs: There were no vitals taken for this visit.  Physical Exam:   Constitutional: Patient appears well-developed HEENT:  Head: Normocephalic Eyes:EOM are normal Neck: Normal range of motion Cardiovascular: Normal rate Pulmonary/chest: Effort normal Neurologic: Patient is alert Skin: Skin is warm Psychiatric: Patient has normal mood and affect   Ortho Exam: Ortho exam demonstrates lack of extension by about 3 to 5 degrees and flexion to about 95.  Mild effusion is present.  Collateral ligaments are stable and left to varus valgus stress at 0 and 30 degrees.  PCL intact.  No posterior lateral rotatory instability is noted.  Patient does have ACL laxity with positive Lachman positive anterior drawer.  Not too much joint line tenderness.  Specialty Comments:  No specialty  comments available.  Imaging: No results found.   PMFS History: Patient Active Problem List   Diagnosis Date Noted   Intermediate stage nonexudative age-related macular degeneration of both eyes 09/03/2020   Nuclear sclerotic cataract of right eye 09/03/2020   Other vitreous opacities, right eye 09/03/2020   Nuclear sclerotic cataract of left eye 09/03/2020   Past Medical History:  Diagnosis Date   High vitamin D level    Migraine headache    Mitral valve prolapse    Muscular degeneration    MVP (mitral valve prolapse)    Protrusion of cervical intervertebral disc    PVC  (premature ventricular contraction)    PVC (premature ventricular contraction)    Right shoulder pain    Sinusitis    Vaginal discomfort    Varicose veins of right lower extremity with pain     Family History  Problem Relation Age of Onset   Alzheimer's disease Mother    Hypertension Father    Cancer Father        prostate   Bipolar disorder Sister    Kidney disease Sister    Hepatitis Brother     Past Surgical History:  Procedure Laterality Date   APPENDECTOMY     BREAST LUMPECTOMY     CESAREAN SECTION     KNEE ARTHROSCOPY     Social History   Occupational History   Not on file  Tobacco Use   Smoking status: Never   Smokeless tobacco: Never  Substance and Sexual Activity   Alcohol use: No   Drug use: Not on file   Sexual activity: Not on file

## 2021-12-08 ENCOUNTER — Ambulatory Visit: Payer: Medicare Other | Admitting: Orthopedic Surgery

## 2021-12-09 DIAGNOSIS — M23612 Other spontaneous disruption of anterior cruciate ligament of left knee: Secondary | ICD-10-CM | POA: Diagnosis not present

## 2021-12-11 ENCOUNTER — Ambulatory Visit: Payer: Medicare Other | Admitting: Orthopedic Surgery

## 2021-12-17 ENCOUNTER — Encounter: Payer: Self-pay | Admitting: Orthopedic Surgery

## 2021-12-23 NOTE — Telephone Encounter (Signed)
Done thx

## 2022-01-02 ENCOUNTER — Ambulatory Visit (INDEPENDENT_AMBULATORY_CARE_PROVIDER_SITE_OTHER): Payer: Medicare PPO | Admitting: Surgical

## 2022-01-02 ENCOUNTER — Other Ambulatory Visit: Payer: Self-pay

## 2022-01-02 ENCOUNTER — Encounter: Payer: Self-pay | Admitting: Orthopedic Surgery

## 2022-01-02 DIAGNOSIS — S83207A Unspecified tear of unspecified meniscus, current injury, left knee, initial encounter: Secondary | ICD-10-CM

## 2022-01-02 DIAGNOSIS — S83512A Sprain of anterior cruciate ligament of left knee, initial encounter: Secondary | ICD-10-CM | POA: Diagnosis not present

## 2022-01-02 NOTE — Progress Notes (Signed)
Office Visit Note   Patient: Alicia Green           Date of Birth: 09-20-1956           MRN: [id number] Visit Date: 01/02/2022 Requested by: Cari Caraway, [place],  [place] [address] PCP: Cari Caraway, MD  Subjective: Chief Complaint  Patient presents with   Left Knee - Follow-up    HPI: Alicia Green is a 66 y.o. female who presents to the office for follow-up on left knee.  She is scheduled for left knee anterior cruciate ligament reconstruction with BTB allograft and meniscal debridement versus repair on 01/13/2022.  She has a series of questions concerning her upcoming procedure.  She denies any history of DVT/PE in her family.  Currently doing well and has been [place] by herself in anticipation of the upcoming surgery.  She states she has measured her knee at 115 degrees of flexion.  She would like to get set up with physical therapy prior to her surgery to get things started.                ROS: All systems reviewed are negative as they relate to the chief complaint within the history of present illness.  Patient denies fevers or chills.  Assessment & Plan: Visit Diagnoses:  1. Tears of meniscus and ACL of left knee, initial encounter     Plan: Patient is a 66 year old female who presents for evaluation prior to surgery on 01/13/2022.  Questions were answered to her satisfaction.  Plan for tramadol for pain control per her request with option for Dilaudid if that fails.  She cannot take oxycodone due to severe nausea vomiting.  Prescription given for handicap placard and physical therapy.  She will continue with rehab exercises up until the day of surgery.  She has achieved appropriate range of motion to proceed with surgery.  Follow-up after procedure.  Follow-Up Instructions: No follow-ups on file.   Orders:  No orders of the defined types were placed in this encounter.  No orders of the defined types were placed in this encounter.      Procedures: No procedures performed   Clinical Data: No additional findings.  Objective: Vital Signs: There were no vitals taken for this visit.  Physical Exam:  Constitutional: Patient appears well-developed HEENT:  Head: Normocephalic Eyes:EOM are normal Neck: Normal range of motion Cardiovascular: Normal rate Pulmonary/chest: Effort normal Neurologic: Patient is alert Skin: Skin is warm Psychiatric: Patient has normal mood and affect  Ortho Exam: Ortho exam demonstrates left knee with 0 degrees extension and 120 degrees of knee flexion.  No calf tenderness.  Negative Homans' sign.  Able to perform straight leg raise with excellent quad strength rated 5/5.  No significant effusion noted.  Laxity on anterior drawer  Specialty Comments:  No specialty comments available.  Imaging: No results found.   PMFS History: Patient Active Problem List   Diagnosis Date Noted   Intermediate stage nonexudative age-related macular degeneration of both eyes 09/03/2020   Nuclear sclerotic cataract of right eye 09/03/2020   Other vitreous opacities, right eye 09/03/2020   Nuclear sclerotic cataract of left eye 09/03/2020   Past Medical History:  Diagnosis Date   High vitamin D level    Migraine headache    Mitral valve prolapse    Muscular degeneration    MVP (mitral valve prolapse)    Protrusion of cervical intervertebral disc    PVC (premature ventricular contraction)  PVC (premature ventricular contraction)    Right shoulder pain    Sinusitis    Vaginal discomfort    Varicose veins of right lower extremity with pain     Family History  Problem Relation Age of Onset   Alzheimer's disease Mother    Hypertension Father    Cancer Father        prostate   Bipolar disorder Sister    Kidney disease Sister    Hepatitis Brother     Past Surgical History:  Procedure Laterality Date   APPENDECTOMY     BREAST LUMPECTOMY     CESAREAN SECTION     KNEE ARTHROSCOPY      Social History   Occupational History   Not on file  Tobacco Use   Smoking status: Never   Smokeless tobacco: Never  Substance and Sexual Activity   Alcohol use: No   Drug use: Not on file   Sexual activity: Not on file

## 2022-01-07 ENCOUNTER — Encounter: Payer: Self-pay | Admitting: Orthopedic Surgery

## 2022-01-07 NOTE — Progress Notes (Signed)
Surgical Instructions    Your procedure is scheduled on 01/13/22.  Report to [facility] Main Entrance "A" at 5:30 A.M., then check in with the Admitting office.  Call this number if you have problems the morning of surgery:  [phone]   If you have any questions prior to your surgery date call [phone]: Open Monday-Friday 8am-4pm    Remember:  Do not eat after midnight the night before your surgery  You may drink clear liquids until 4:30am the morning of your surgery.   Clear liquids allowed are: Water, Non-Citrus Juices (without pulp), Carbonated Beverages, Clear Tea, Black Coffee ONLY (NO MILK, CREAM OR POWDERED CREAMER of any kind), and Gatorade  Patient Instructions  The night before surgery:  No food after midnight. ONLY clear liquids after midnight  The day of surgery (if you do NOT have diabetes):  Drink ONE (1) Pre-Surgery Clear Ensure by 4:30am the morning of surgery. Drink in one sitting. Do not sip.  This drink was given to you during your hospital  pre-op appointment visit. Nothing else to drink after completing the  Pre-Surgery Clear Ensure.          If you have questions, please contact your surgeons office.     Take these medicines the morning of surgery with A SIP OF WATER: NONE   As of today, STOP taking any Aspirin (unless otherwise instructed by your surgeon) Aleve, Naproxen, Ibuprofen, Motrin, Advil, Goody's, BC's, all herbal medications, fish oil, and all vitamins.  After your COVID test   You are not required to quarantine however you are required to wear a well-fitting mask when you are out and around people not in your household.  If your mask becomes wet or soiled, replace with a new one.  Wash your hands often with soap and water for 20 seconds or clean your hands with an alcohol-based hand sanitizer that contains at least 60% alcohol.  Do not share personal items.  Notify your provider: if you are in close contact with someone who has  COVID  or if you develop a fever of 100.4 or greater, sneezing, cough, sore throat, shortness of breath or body aches.           Do not wear jewelry or makeup Do not wear lotions, powders, perfumes/colognes, or deodorant. Do not shave 48 hours prior to surgery.  Do not bring valuables to the hospital. DO Not wear nail polish, gel polish, artificial nails, or any other type of covering on natural nails (fingers and toes) If you have artificial nails or gel coating that need to be removed by a nail salon, please have this removed prior to surgery. Artificial nails or gel coating may interfere with anesthesia's ability to adequately monitor your vital signs.             [facility] is not responsible for any belongings or valuables.  Do NOT Smoke (Tobacco/Vaping)  24 hours prior to your procedure  If you use a CPAP at night, you may bring your mask for your overnight stay.   Contacts, glasses, hearing aids, dentures or partials may not be worn into surgery, please bring cases for these belongings   For patients admitted to the hospital, discharge time will be determined by your treatment team.   Patients discharged the day of surgery will not be allowed to drive home, and someone needs to stay with them for 24 hours.  NO VISITORS WILL BE ALLOWED IN PRE-OP WHERE PATIENTS ARE PREPPED FOR  SURGERY.  ONLY 1 SUPPORT PERSON MAY BE PRESENT IN THE WAITING ROOM WHILE YOU ARE IN SURGERY.  IF YOU ARE TO BE ADMITTED, ONCE YOU ARE IN YOUR ROOM YOU WILL BE ALLOWED TWO (2) VISITORS. 1 (ONE) VISITOR MAY STAY OVERNIGHT BUT MUST ARRIVE TO THE ROOM BY 8pm.  Minor children may have two parents present. Special consideration for safety and communication needs will be reviewed on a case by case basis.  Special instructions:    Oral Hygiene is also important to reduce your risk of infection.  Remember - BRUSH YOUR TEETH THE MORNING OF SURGERY WITH YOUR REGULAR TOOTHPASTE   - Preparing For  Surgery  Before surgery, you can play an important role. Because skin is not sterile, your skin needs to be as free of germs as possible. You can reduce the number of germs on your skin by washing with CHG (chlorahexidine gluconate) Soap before surgery.  CHG is an antiseptic cleaner which kills germs and bonds with the skin to continue killing germs even after washing.     Please do not use if you have an allergy to CHG or antibacterial soaps. If your skin becomes reddened/irritated stop using the CHG.  Do not shave (including legs and underarms) for at least 48 hours prior to first CHG shower. It is OK to shave your face.  Please follow these instructions carefully.     Shower the NIGHT BEFORE SURGERY and the MORNING OF SURGERY with CHG Soap.   If you chose to wash your hair, wash your hair first as usual with your normal shampoo. After you shampoo, rinse your hair and body thoroughly to remove the shampoo.  Then [employer] and genitals (private parts) with your normal soap and rinse thoroughly to remove soap.  After that Use CHG Soap as you would any other liquid soap. You can apply CHG directly to the skin and wash gently with a scrungie or a clean washcloth.   Apply the CHG Soap to your body ONLY FROM THE NECK DOWN.  Do not use on open wounds or open sores. Avoid contact with your eyes, ears, mouth and genitals (private parts). Wash Face and genitals (private parts)  with your normal soap.   Wash thoroughly, paying special attention to the area where your surgery will be performed.  Thoroughly rinse your body with warm water from the neck down.  DO NOT shower/wash with your normal soap after using and rinsing off the CHG Soap.  Pat yourself dry with a CLEAN TOWEL.  Wear CLEAN PAJAMAS to bed the night before surgery  Place CLEAN SHEETS on your bed the night before your surgery  DO NOT SLEEP WITH PETS.   Day of Surgery: Take a shower with CHG soap. Wear Clean/Comfortable clothing  the morning of surgery Do not apply any deodorants/lotions.   Remember to brush your teeth WITH YOUR REGULAR TOOTHPASTE.   Please read over the following fact sheets that you were given.

## 2022-01-08 ENCOUNTER — Encounter (HOSPITAL_COMMUNITY): Payer: Self-pay

## 2022-01-08 ENCOUNTER — Other Ambulatory Visit: Payer: Self-pay

## 2022-01-08 ENCOUNTER — Encounter (HOSPITAL_COMMUNITY)
Admission: RE | Admit: 2022-01-08 | Discharge: 2022-01-08 | Disposition: A | Discharge status: Home / Self Care | Payer: Medicare PPO | Source: Ambulatory Visit | Attending: Orthopedic Surgery | Admitting: Orthopedic Surgery

## 2022-01-08 DIAGNOSIS — Z01812 Encounter for preprocedural laboratory examination: Secondary | ICD-10-CM | POA: Insufficient documentation

## 2022-01-08 DIAGNOSIS — Z01818 Encounter for other preprocedural examination: Secondary | ICD-10-CM

## 2022-01-08 DIAGNOSIS — I4510 Unspecified right bundle-branch block: Secondary | ICD-10-CM | POA: Insufficient documentation

## 2022-01-08 DIAGNOSIS — S83207A Unspecified tear of unspecified meniscus, current injury, left knee, initial encounter: Secondary | ICD-10-CM | POA: Diagnosis not present

## 2022-01-08 DIAGNOSIS — I493 Ventricular premature depolarization: Secondary | ICD-10-CM | POA: Diagnosis not present

## 2022-01-08 DIAGNOSIS — I8391 Asymptomatic varicose veins of right lower extremity: Secondary | ICD-10-CM | POA: Diagnosis not present

## 2022-01-08 DIAGNOSIS — M25539 Pain in unspecified wrist: Secondary | ICD-10-CM | POA: Insufficient documentation

## 2022-01-08 DIAGNOSIS — R55 Syncope and collapse: Secondary | ICD-10-CM | POA: Insufficient documentation

## 2022-01-08 DIAGNOSIS — Y9323 Activity, snow (alpine) (downhill) skiing, snow boarding, sledding, tobogganing and snow tubing: Secondary | ICD-10-CM | POA: Insufficient documentation

## 2022-01-08 DIAGNOSIS — I341 Nonrheumatic mitral (valve) prolapse: Secondary | ICD-10-CM | POA: Diagnosis not present

## 2022-01-08 DIAGNOSIS — I440 Atrioventricular block, first degree: Secondary | ICD-10-CM | POA: Insufficient documentation

## 2022-01-08 DIAGNOSIS — Y33XXXA Other specified events, undetermined intent, initial encounter: Secondary | ICD-10-CM | POA: Diagnosis not present

## 2022-01-08 DIAGNOSIS — S83512A Sprain of anterior cruciate ligament of left knee, initial encounter: Secondary | ICD-10-CM | POA: Diagnosis not present

## 2022-01-08 DIAGNOSIS — R002 Palpitations: Secondary | ICD-10-CM | POA: Diagnosis not present

## 2022-01-08 HISTORY — DX: Unspecified macular degeneration: H35.30

## 2022-01-08 LAB — BASIC METABOLIC PANEL
Anion gap: 7 (ref 5–15)
BUN: 13 mg/dL (ref 8–23)
CO2: 30 mmol/L (ref 22–32)
Calcium: 9.1 mg/dL (ref 8.9–10.3)
Chloride: 98 mmol/L (ref 98–111)
Creatinine, Ser: 0.73 mg/dL (ref 0.44–1.00)
GFR, Estimated: >60 mL/min (ref >60)
Glucose, Bld: 95 mg/dL (ref 70–99)
Potassium: 4.6 mmol/L (ref 3.5–5.1)
Sodium: 135 mmol/L (ref 135–145)

## 2022-01-08 LAB — CBC
HCT: 38.7 % (ref 36.0–46.0)
Hemoglobin: 13.2 g/dL (ref 12.0–15.0)
MCH: 31.4 pg (ref 26.0–34.0)
MCHC: 34.1 g/dL (ref 30.0–36.0)
MCV: 91.9 fL (ref 80.0–100.0)
Platelets: 213 10*3/uL (ref 150–400)
RBC: 4.21 MIL/uL (ref 3.87–5.11)
RDW: 13.2 % (ref 11.5–15.5)
WBC: 5.3 10*3/uL (ref 4.0–10.5)
nRBC: 0 % (ref 0.0–0.2)

## 2022-01-08 NOTE — Progress Notes (Signed)
PCP - Dr. Theadore Nan Cardiologist - Dr. Candee Furbish  PPM/ICD - n/a  Chest x-ray - n/a EKG - 02/20/21 Stress Test - 15+ years ago-reported normal ECHO - 03/11/21 Cardiac Cath - denies  Sleep Study - denies CPAP - denies  Blood Thinner Instructions: n/a Aspirin Instructions: n/a  ERAS Protcol -Clear liquids until 0430 DOS PRE-SURGERY Ensure or G2- (1) Ensure provided.  COVID TEST- Not indicated. Ambulatory surgery.  Anesthesia review: Yes, hx of MVP.  Patient denies shortness of breath, fever, cough and chest pain at PAT appointment   All instructions explained to the patient, with a verbal understanding of the material. Patient agrees to go over the instructions while at home for a better understanding. Patient also instructed to self quarantine after being tested for COVID-19. The opportunity to ask questions was provided.

## 2022-01-09 ENCOUNTER — Encounter (HOSPITAL_COMMUNITY): Payer: Self-pay

## 2022-01-09 NOTE — Anesthesia Preprocedure Evaluation (Addendum)
Anesthesia Evaluation  Patient identified by MRN, date of birth, ID band Patient awake    Reviewed: Allergy & Precautions, NPO status , Patient's Chart, lab work & pertinent test results  Airway Mallampati: II  TM Distance: >3 FB Neck ROM: Full    Dental no notable dental hx.    Pulmonary neg pulmonary ROS,    Pulmonary exam normal breath sounds clear to auscultation       Cardiovascular negative cardio ROS Normal cardiovascular exam Rhythm:Regular Rate:Normal     Neuro/Psych negative neurological ROS  negative psych ROS   GI/Hepatic negative GI ROS, Neg liver ROS,   Endo/Other  negative endocrine ROS  Renal/GU negative Renal ROS  negative genitourinary   Musculoskeletal negative musculoskeletal ROS (+)   Abdominal   Peds negative pediatric ROS (+)  Hematology negative hematology ROS (+)   Anesthesia Other Findings   Reproductive/Obstetrics negative OB ROS                             Anesthesia Physical Anesthesia Plan  ASA: 2  Anesthesia Plan: General   Post-op Pain Management: Regional block   Induction: Intravenous  PONV Risk Score and Plan: 3 and Ondansetron, Dexamethasone, Midazolam and Treatment may vary due to age or medical condition  Airway Management Planned: LMA  Additional Equipment:   Intra-op Plan:   Post-operative Plan: Extubation in OR  Informed Consent: I have reviewed the patients History and Physical, chart, labs and discussed the procedure including the risks, benefits and alternatives for the proposed anesthesia with the patient or authorized representative who has indicated his/her understanding and acceptance.     Dental advisory given  Plan Discussed with: CRNA and Surgeon  Anesthesia Plan Comments: (PAT note written 01/09/2022 by Myra Gianotti, PA-C. )       Anesthesia Quick Evaluation

## 2022-01-09 NOTE — Progress Notes (Signed)
Anesthesia Chart Review:  Case: [id number] Date/Time: 01/13/22 0715   Procedure: left knee anterior cruciate ligament reconstruction with bone patella tendon bone allograft; meniscal debridement vs repair, arthroscopy (Left: Knee)   Anesthesia type: General   Pre-op diagnosis: left knee anterior cruciate ligament tear, meniscal tear   Location: [facility] ROOM 06 / Alicia Green   Surgeons: Meredith Pel, MD       DISCUSSION: Patient is a 66 year old Green scheduled for the above procedure.  She injured her left knee (felt a pop when knee was forced into a lateral position) while skiing on 11/09/2021.  History includes never smoker, MVP (normal MV structure with mild MR 03/11/21 echo), PVCs, varicose veins (RLE)  She was evaluated by cardiologist Dr. Marlou Porch on 02/20/21 for syncopal episode, abnormal EKG (RBBB, 1st degree AVB), palpitations and episode of chest pressure when walking up hill. Reportedly previous echo had showed mild MVP. Syncope had happened in setting of wrist pain after pickle ball, so felt likely vasovagal although would consider Ziopatch monitor in the future if recurrent episodes. Echo showed normal LVEF, normal MV structure, mild MR, no PFO. 02/2021 CCTA showed Calcium score of 0 with no evidence of CAD.  Anesthesia team to evaluate on the day of surgery.  Preoperative CBC and BMP are normal.   VS: BP 113/66    Temp 36.7 C (Oral)    Resp 17    Ht 5\' 6"  (1.676 m)    Wt 61 kg    BMI 21.71 kg/m    PROVIDERS: Cari Caraway, MD is PCP Vanice Sarah, MD is cardiologist   LABS: Labs reviewed: Acceptable for surgery. (all labs ordered are listed, but only abnormal results are displayed)  Labs Reviewed  CBC  BASIC METABOLIC PANEL     IMAGES: MRI Left Knee 12/02/21: IMPRESSION: 1. Complete ACL tear with associated pivot-shift bone injuries including a comminuted, mildly depressed fracture involving the posterior aspect of the lateral tibial plateau. 2. Grade 2 MCL  sprain. 3. Medial meniscal contusion with subtle undersurface tear of the posterior horn. 4. Moderate-sized knee joint effusion.     EKG: 02/20/21 ([facility]): SR. RBBB. First degree AV block.   CV: Echo with Bubble Study 03/11/21: - Sonographer Comments: Technically difficult study due to poor echo  windows. Image acquisition challenging due to patient body habitus and  Image acquisition challenging due to respiratory motion.  IMPRESSIONS   1. Left ventricular ejection fraction, by estimation, is 60 to 65%. The  left ventricle has normal function. The left ventricle has no regional  wall motion abnormalities. Left ventricular diastolic parameters were  normal.   2. Right ventricular systolic function is normal. The right ventricular  size is normal.   3. The mitral valve is normal in structure. Mild mitral valve  regurgitation.   4. The aortic valve is normal in structure. Aortic valve regurgitation is  trivial.  - IAS/Shunts: No atrial level shunt detected by color flow Doppler. Agitated  saline contrast was given intravenously to evaluate for intracardiac  shunting.   CT Coronary 03/05/21: IMPRESSION: 1. Coronary calcium score of 0. This was 0 percentile for age and sex matched control. 2.  Normal coronary origin with right dominance. 3.  No evidence of CAD.  CAD RADS 0. 4.  Consider non atherosclerotic causes of chest pain.   Past Medical History:  Diagnosis Date   High vitamin D level    Macular degeneration    Migraine headache    MVP (mitral valve prolapse)  Protrusion of cervical intervertebral disc    PVC (premature ventricular contraction)    Right shoulder pain    Sinusitis    Vaginal discomfort    Varicose veins of right lower extremity with pain     Past Surgical History:  Procedure Laterality Date   APPENDECTOMY     BREAST LUMPECTOMY     CESAREAN SECTION     KNEE ARTHROSCOPY Left    WRIST SURGERY Left 05/2021   ORIF    MEDICATIONS:   ASHWAGANDHA PO   GLUCOSAMINE-CHONDROITIN PO   Glutamine POWD   GLUTATHIONE PO   Homeopathic Products (ARNICA EX)   ibuprofen (ADVIL) 200 MG tablet   Magnesium Hydroxide (MAGNESIA PO)   Multiple Vitamins-Minerals (PRESERVISION AREDS 2 PO)   Omega-3 Fatty Acids (FISH OIL PO)   OVER THE COUNTER MEDICATION   OVER THE COUNTER MEDICATION   OVER THE COUNTER MEDICATION   risedronate (ACTONEL) 150 MG tablet   TURMERIC CURCUMIN PO   No current facility-administered medications for this encounter.    sodium chloride flush (NS) 0.9 % injection 32 mL    Myra Gianotti, PA-C Surgical Short Stay/Anesthesiology [facility] Phone [phone] [facility] Phone [phone] 01/09/2022 3:46 PM

## 2022-01-13 ENCOUNTER — Ambulatory Visit (HOSPITAL_COMMUNITY): Payer: Medicare PPO | Admitting: Vascular Surgery

## 2022-01-13 ENCOUNTER — Ambulatory Visit (HOSPITAL_COMMUNITY)
Admission: RE | Admit: 2022-01-13 | Discharge: 2022-01-13 | Disposition: A | Discharge status: Home / Self Care | Payer: Medicare PPO | Attending: Orthopedic Surgery | Admitting: Orthopedic Surgery

## 2022-01-13 ENCOUNTER — Ambulatory Visit (HOSPITAL_COMMUNITY): Payer: Medicare PPO | Admitting: Certified Registered Nurse Anesthetist

## 2022-01-13 ENCOUNTER — Encounter (HOSPITAL_COMMUNITY): Payer: Self-pay | Admitting: Orthopedic Surgery

## 2022-01-13 ENCOUNTER — Other Ambulatory Visit: Payer: Self-pay

## 2022-01-13 ENCOUNTER — Encounter (HOSPITAL_COMMUNITY)
Admission: RE | Disposition: A | Discharge status: Home / Self Care | Payer: Self-pay | Source: Home / Self Care | Attending: Orthopedic Surgery

## 2022-01-13 DIAGNOSIS — S83512D Sprain of anterior cruciate ligament of left knee, subsequent encounter: Secondary | ICD-10-CM

## 2022-01-13 DIAGNOSIS — S83242D Other tear of medial meniscus, current injury, left knee, subsequent encounter: Secondary | ICD-10-CM

## 2022-01-13 DIAGNOSIS — Z01818 Encounter for other preprocedural examination: Secondary | ICD-10-CM

## 2022-01-13 DIAGNOSIS — Z79899 Other long term (current) drug therapy: Secondary | ICD-10-CM | POA: Insufficient documentation

## 2022-01-13 DIAGNOSIS — S83512A Sprain of anterior cruciate ligament of left knee, initial encounter: Secondary | ICD-10-CM | POA: Diagnosis not present

## 2022-01-13 DIAGNOSIS — X58XXXA Exposure to other specified factors, initial encounter: Secondary | ICD-10-CM | POA: Diagnosis not present

## 2022-01-13 DIAGNOSIS — Y9323 Activity, snow (alpine) (downhill) skiing, snow boarding, sledding, tobogganing and snow tubing: Secondary | ICD-10-CM | POA: Diagnosis not present

## 2022-01-13 DIAGNOSIS — M25562 Pain in left knee: Secondary | ICD-10-CM | POA: Diagnosis not present

## 2022-01-13 DIAGNOSIS — S83242A Other tear of medial meniscus, current injury, left knee, initial encounter: Secondary | ICD-10-CM | POA: Diagnosis not present

## 2022-01-13 DIAGNOSIS — I341 Nonrheumatic mitral (valve) prolapse: Secondary | ICD-10-CM | POA: Insufficient documentation

## 2022-01-13 HISTORY — PX: ANTERIOR CRUCIATE LIGAMENT REPAIR: SHX115

## 2022-01-13 HISTORY — DX: Age-related osteoporosis without current pathological fracture: M81.0

## 2022-01-13 SURGERY — RECONSTRUCTION, KNEE, ACL
Anesthesia: General | Site: Knee | Laterality: Left

## 2022-01-13 MED ORDER — PHENYLEPHRINE 40 MCG/ML (10ML) SYRINGE FOR IV PUSH (FOR BLOOD PRESSURE SUPPORT)
PREFILLED_SYRINGE | INTRAVENOUS | Status: DC | PRN
  Administered 2022-01-13: 120 ug via INTRAVENOUS

## 2022-01-13 MED ORDER — ROCURONIUM BROMIDE 10 MG/ML (PF) SYRINGE
PREFILLED_SYRINGE | INTRAVENOUS | Status: AC
  Filled 2022-01-13: qty 10

## 2022-01-13 MED ORDER — PROPOFOL 10 MG/ML IV BOLUS
INTRAVENOUS | Status: DC | PRN
  Administered 2022-01-13: 200 mg via INTRAVENOUS

## 2022-01-13 MED ORDER — FENTANYL CITRATE (PF) 250 MCG/5ML IJ SOLN
INTRAMUSCULAR | Status: DC | PRN
  Administered 2022-01-13 (×3): 50 ug via INTRAVENOUS

## 2022-01-13 MED ORDER — TRAMADOL HCL 50 MG PO TABS
ORAL_TABLET | ORAL | Status: AC
  Filled 2022-01-13: qty 2

## 2022-01-13 MED ORDER — ROPIVACAINE HCL 5 MG/ML IJ SOLN
INTRAMUSCULAR | Status: DC | PRN
  Administered 2022-01-13: 10 mL via PERINEURAL
  Administered 2022-01-13: 20 mL via PERINEURAL

## 2022-01-13 MED ORDER — 0.9 % SODIUM CHLORIDE (POUR BTL) OPTIME
TOPICAL | Status: DC | PRN
  Administered 2022-01-13: 08:00:00 1000 mL

## 2022-01-13 MED ORDER — EPINEPHRINE PF 1 MG/ML IJ SOLN
INTRAMUSCULAR | Status: DC | PRN
Start: 2022-01-13 — End: 2022-01-13
  Administered 2022-01-13 (×4): 1 mg

## 2022-01-13 MED ORDER — POVIDONE-IODINE 10 % EX SWAB: 2.0000 "application " | Freq: Once | CUTANEOUS | Status: DC

## 2022-01-13 MED ORDER — TRAMADOL HCL 50 MG PO TABS: 100.0000 mg | ORAL_TABLET | Freq: Four times a day (QID) | ORAL | 0 refills | Status: DC | PRN

## 2022-01-13 MED ORDER — VANCOMYCIN HCL 1000 MG IV SOLR
INTRAVENOUS | Status: AC
  Filled 2022-01-13: qty 20

## 2022-01-13 MED ORDER — DEXAMETHASONE SODIUM PHOSPHATE 10 MG/ML IJ SOLN
INTRAMUSCULAR | Status: DC | PRN
  Administered 2022-01-13: 10 mg via INTRAVENOUS

## 2022-01-13 MED ORDER — HYDROMORPHONE HCL 1 MG/ML IJ SOLN: 0.2500 mg | INTRAMUSCULAR | Status: DC | PRN

## 2022-01-13 MED ORDER — HEPARIN SODIUM (PORCINE) 1000 UNIT/ML IJ SOLN
INTRAMUSCULAR | Status: AC
  Filled 2022-01-13: qty 1

## 2022-01-13 MED ORDER — METHOCARBAMOL 500 MG PO TABS: 500.0000 mg | ORAL_TABLET | Freq: Three times a day (TID) | ORAL | 0 refills | Status: AC | PRN

## 2022-01-13 MED ORDER — KETOROLAC TROMETHAMINE 30 MG/ML IJ SOLN
INTRAMUSCULAR | Status: AC
  Filled 2022-01-13: qty 1

## 2022-01-13 MED ORDER — DEXAMETHASONE SODIUM PHOSPHATE 10 MG/ML IJ SOLN
INTRAMUSCULAR | Status: AC
  Filled 2022-01-13: qty 1

## 2022-01-13 MED ORDER — LIDOCAINE 2% (20 MG/ML) 5 ML SYRINGE
INTRAMUSCULAR | Status: AC
  Filled 2022-01-13: qty 5

## 2022-01-13 MED ORDER — PHENYLEPHRINE HCL-NACL 20-0.9 MG/250ML-% IV SOLN
INTRAVENOUS | Status: DC | PRN
  Administered 2022-01-13: 30 ug/min via INTRAVENOUS

## 2022-01-13 MED ORDER — ORAL CARE MOUTH RINSE: 15.0000 mL | Freq: Once | OROMUCOSAL | Status: AC

## 2022-01-13 MED ORDER — IBUPROFEN 600 MG PO TABS: 600.0000 mg | ORAL_TABLET | Freq: Three times a day (TID) | ORAL | 0 refills | Status: AC | PRN

## 2022-01-13 MED ORDER — POVIDONE-IODINE 7.5 % EX SOLN
Freq: Once | CUTANEOUS | Status: DC
Start: 2022-01-13 — End: 2022-01-13
  Filled 2022-01-13: qty 118

## 2022-01-13 MED ORDER — ONDANSETRON HCL 4 MG/2ML IJ SOLN
INTRAMUSCULAR | Status: DC | PRN
  Administered 2022-01-13 (×2): 4 mg via INTRAVENOUS

## 2022-01-13 MED ORDER — GABAPENTIN 300 MG PO CAPS: 300.0000 mg | ORAL_CAPSULE | Freq: Three times a day (TID) | ORAL | 0 refills | Status: AC

## 2022-01-13 MED ORDER — CLONIDINE HCL (ANALGESIA) 100 MCG/ML EP SOLN
EPIDURAL | Status: DC | PRN
  Administered 2022-01-13: 75 ug

## 2022-01-13 MED ORDER — FENTANYL CITRATE (PF) 250 MCG/5ML IJ SOLN
INTRAMUSCULAR | Status: AC
  Filled 2022-01-13: qty 5

## 2022-01-13 MED ORDER — MORPHINE SULFATE (PF) 4 MG/ML IV SOLN
INTRAVENOUS | Status: DC | PRN
  Administered 2022-01-13 (×2): 4 mg

## 2022-01-13 MED ORDER — MIDAZOLAM HCL 2 MG/2ML IJ SOLN
INTRAMUSCULAR | Status: AC
  Filled 2022-01-13: qty 2

## 2022-01-13 MED ORDER — CEFAZOLIN SODIUM-DEXTROSE 2-4 GM/100ML-% IV SOLN
2.0000 g | INTRAVENOUS | Status: DC
  Filled 2022-01-13: qty 100

## 2022-01-13 MED ORDER — CLONIDINE HCL (ANALGESIA) 100 MCG/ML EP SOLN
EPIDURAL | Status: AC
  Filled 2022-01-13: qty 10

## 2022-01-13 MED ORDER — ONDANSETRON HCL 4 MG/2ML IJ SOLN
INTRAMUSCULAR | Status: AC
  Filled 2022-01-13: qty 2

## 2022-01-13 MED ORDER — BUPIVACAINE HCL (PF) 0.25 % IJ SOLN
INTRAMUSCULAR | Status: AC
  Filled 2022-01-13: qty 30

## 2022-01-13 MED ORDER — TRAMADOL HCL 50 MG PO TABS
100.0000 mg | ORAL_TABLET | Freq: Once | ORAL | Status: AC
  Administered 2022-01-13: 11:00:00 100 mg via ORAL

## 2022-01-13 MED ORDER — SCOPOLAMINE 1 MG/3DAYS TD PT72
MEDICATED_PATCH | TRANSDERMAL | Status: DC | PRN
  Administered 2022-01-13: 1 via TRANSDERMAL

## 2022-01-13 MED ORDER — BUPIVACAINE HCL (PF) 0.25 % IJ SOLN
INTRAMUSCULAR | Status: AC
  Filled 2022-01-13: qty 10

## 2022-01-13 MED ORDER — KETOROLAC TROMETHAMINE 30 MG/ML IJ SOLN
30.0000 mg | Freq: Once | INTRAMUSCULAR | Status: AC | PRN
  Administered 2022-01-13: 11:00:00 30 mg via INTRAVENOUS

## 2022-01-13 MED ORDER — MUPIROCIN 2 % EX OINT
1.0000 "application " | TOPICAL_OINTMENT | Freq: Once | CUTANEOUS | Status: AC
  Administered 2022-01-13: 1 via TOPICAL
  Filled 2022-01-13: qty 22

## 2022-01-13 MED ORDER — EPINEPHRINE PF 1 MG/ML IJ SOLN
INTRAMUSCULAR | Status: AC
  Filled 2022-01-13: qty 4

## 2022-01-13 MED ORDER — MORPHINE SULFATE (PF) 4 MG/ML IV SOLN
INTRAVENOUS | Status: AC
  Filled 2022-01-13: qty 2

## 2022-01-13 MED ORDER — MIDAZOLAM HCL 2 MG/2ML IJ SOLN
INTRAMUSCULAR | Status: DC | PRN
  Administered 2022-01-13: 1 mg via INTRAVENOUS

## 2022-01-13 MED ORDER — PHENYLEPHRINE 40 MCG/ML (10ML) SYRINGE FOR IV PUSH (FOR BLOOD PRESSURE SUPPORT)
PREFILLED_SYRINGE | INTRAVENOUS | Status: AC
  Filled 2022-01-13: qty 10

## 2022-01-13 MED ORDER — HEPARIN SODIUM (PORCINE) 1000 UNIT/ML IJ SOLN
INTRAMUSCULAR | Status: DC | PRN
  Administered 2022-01-13: 5000 [IU]

## 2022-01-13 MED ORDER — LIDOCAINE 2% (20 MG/ML) 5 ML SYRINGE
INTRAMUSCULAR | Status: DC | PRN
Start: 2022-01-13 — End: 2022-01-13
  Administered 2022-01-13: 60 mg via INTRAVENOUS

## 2022-01-13 MED ORDER — LACTATED RINGERS IV SOLN: INTRAVENOUS | Status: DC | PRN

## 2022-01-13 MED ORDER — BUPIVACAINE HCL (PF) 0.25 % IJ SOLN
INTRAMUSCULAR | Status: DC | PRN
  Administered 2022-01-13: 30 mL

## 2022-01-13 MED ORDER — SODIUM CHLORIDE 0.9 % IR SOLN
Status: DC | PRN
  Administered 2022-01-13 (×2): 3000 mL

## 2022-01-13 MED ORDER — BUPIVACAINE-EPINEPHRINE (PF) 0.25% -1:200000 IJ SOLN
INTRAMUSCULAR | Status: AC
  Filled 2022-01-13: qty 30

## 2022-01-13 MED ORDER — ACD FORMULA A 0.73-2.45-2.2 GM/100ML VI SOLN
Status: AC | PRN
  Administered 2022-01-13: 8 mL

## 2022-01-13 MED ORDER — LIDOCAINE-EPINEPHRINE 2 %-1:100000 IJ SOLN
INTRAMUSCULAR | Status: DC | PRN
  Administered 2022-01-13 (×2): 10 mL via PERINEURAL

## 2022-01-13 MED ORDER — VANCOMYCIN HCL 1 G IV SOLR
INTRAVENOUS | Status: DC | PRN
  Administered 2022-01-13: 1000 mg via TOPICAL

## 2022-01-13 MED ORDER — SODIUM CHLORIDE (PF) 0.9 % IJ SOLN
INTRAMUSCULAR | Status: DC | PRN
  Administered 2022-01-13: 5 mL

## 2022-01-13 MED ORDER — ONDANSETRON HCL 4 MG/2ML IJ SOLN: 4.0000 mg | Freq: Once | INTRAMUSCULAR | Status: DC | PRN

## 2022-01-13 MED ORDER — BUPIVACAINE-EPINEPHRINE (PF) 0.25% -1:200000 IJ SOLN
INTRAMUSCULAR | Status: DC | PRN
  Administered 2022-01-13: 30 mL via PERINEURAL

## 2022-01-13 MED ORDER — CHLORHEXIDINE GLUCONATE 0.12 % MT SOLN
15.0000 mL | Freq: Once | OROMUCOSAL | Status: AC
  Administered 2022-01-13: 07:00:00 15 mL via OROMUCOSAL
  Filled 2022-01-13: qty 15

## 2022-01-13 MED ORDER — PROPOFOL 500 MG/50ML IV EMUL
INTRAVENOUS | Status: DC | PRN
  Administered 2022-01-13: 25 ug/kg/min via INTRAVENOUS

## 2022-01-13 SURGICAL SUPPLY — 88 items
BAG COUNTER SPONGE SURGICOUNT (BAG) IMPLANT
BAG SURGICOUNT SPONGE COUNTING (BAG)
BANDAGE ESMARK 6X9 LF (GAUZE/BANDAGES/DRESSINGS) ×1 IMPLANT
BLADE EXCALIBUR 4.0MM X 13CM (MISCELLANEOUS) ×1
BLADE EXCALIBUR 4.0X13 (MISCELLANEOUS) ×2 IMPLANT
BLADE LONG MED 31MMX9MM (MISCELLANEOUS) ×1
BLADE LONG MED 31X9 (MISCELLANEOUS) ×1 IMPLANT
BLADE SURG 10 STRL SS (BLADE) ×3 IMPLANT
BLADE SURG 15 STRL LF DISP TIS (BLADE) ×2 IMPLANT
BLADE SURG 15 STRL SS (BLADE) ×4
BNDG ELASTIC 6X10 VLCR STRL LF (GAUZE/BANDAGES/DRESSINGS) ×2 IMPLANT
BNDG ELASTIC 6X15 VLCR STRL LF (GAUZE/BANDAGES/DRESSINGS) ×3 IMPLANT
BNDG ESMARK 6X9 LF (GAUZE/BANDAGES/DRESSINGS) ×3
COVER SURGICAL LIGHT HANDLE (MISCELLANEOUS) ×3 IMPLANT
CUFF TOURN SGL QUICK 34 (TOURNIQUET CUFF)
CUFF TOURN SGL QUICK 42 (TOURNIQUET CUFF) IMPLANT
CUFF TRNQT CYL 34X4.125X (TOURNIQUET CUFF) IMPLANT
CUTTER BONE 4.0MM X 13CM (MISCELLANEOUS) ×3 IMPLANT
DECANTER SPIKE VIAL GLASS SM (MISCELLANEOUS) ×3 IMPLANT
DRAPE ARTHROSCOPY W/POUCH 114 (DRAPES) ×3 IMPLANT
DRAPE INCISE IOBAN 66X45 STRL (DRAPES) ×3 IMPLANT
DRAPE OEC MINIVIEW 54X84 (DRAPES) IMPLANT
DRAPE ORTHO SPLIT 77X108 STRL (DRAPES) ×2
DRAPE SURG ORHT 6 SPLT 77X108 (DRAPES) ×1 IMPLANT
DRAPE U-SHAPE 47X51 STRL (DRAPES) ×3 IMPLANT
DRSG PAD ABDOMINAL 8X10 ST (GAUZE/BANDAGES/DRESSINGS) ×9 IMPLANT
DRSG TEGADERM 4X4.75 (GAUZE/BANDAGES/DRESSINGS) ×2 IMPLANT
DRSG XEROFORM 1X8 (GAUZE/BANDAGES/DRESSINGS) ×4 IMPLANT
DW OUTFLOW CASSETTE/TUBE SET (MISCELLANEOUS) ×3 IMPLANT
ELECT REM PT RETURN 9FT ADLT (ELECTROSURGICAL) ×3
ELECTRODE REM PT RTRN 9FT ADLT (ELECTROSURGICAL) ×1 IMPLANT
FIBERSTICK 2 (SUTURE) ×2 IMPLANT
GAUZE SPONGE 4X4 12PLY STRL (GAUZE/BANDAGES/DRESSINGS) ×6 IMPLANT
GAUZE SPONGE 4X4 12PLY STRL LF (GAUZE/BANDAGES/DRESSINGS) ×4 IMPLANT
GAUZE XEROFORM 1X8 LF (GAUZE/BANDAGES/DRESSINGS) ×3 IMPLANT
GLOVE SRG 8 PF TXTR STRL LF DI (GLOVE) ×1 IMPLANT
GLOVE SURG ENC MOIS LTX SZ7 (GLOVE) ×6 IMPLANT
GLOVE SURG LTX SZ8 (GLOVE) ×3 IMPLANT
GLOVE SURG UNDER LTX SZ7 (GLOVE) ×3 IMPLANT
GLOVE SURG UNDER POLY LF SZ8 (GLOVE) ×2
GOWN STRL REUS W/ TWL LRG LVL3 (GOWN DISPOSABLE) ×3 IMPLANT
GOWN STRL REUS W/ TWL XL LVL3 (GOWN DISPOSABLE) ×1 IMPLANT
GOWN STRL REUS W/TWL LRG LVL3 (GOWN DISPOSABLE) ×6
GOWN STRL REUS W/TWL XL LVL3 (GOWN DISPOSABLE) ×2
IMMOBILIZER KNEE 20 (SOFTGOODS) ×3
IMMOBILIZER KNEE 20 THIGH 36 (SOFTGOODS) IMPLANT
IMP SYS 2ND FIX PEEK 4.75X19.1 (Miscellaneous) ×3 IMPLANT
IMPL SYS 2ND FX PEEK 4.75X19.1 (Miscellaneous) IMPLANT
KIT BASIN OR (CUSTOM PROCEDURE TRAY) ×3 IMPLANT
KIT BIOCARTILAGE LG JOINT MIX (KITS) ×3 IMPLANT
KIT BONE MRW ASP ANGEL CPRP (KITS) ×2 IMPLANT
KIT TURNOVER KIT B (KITS) ×3 IMPLANT
MANIFOLD NEPTUNE II (INSTRUMENTS) ×3 IMPLANT
NDL 18GX1X1/2 (RX/OR ONLY) (NEEDLE) ×1 IMPLANT
NEEDLE 18GX1X1/2 (RX/OR ONLY) (NEEDLE) ×3 IMPLANT
NS IRRIG 1000ML POUR BTL (IV SOLUTION) ×3 IMPLANT
PACK ARTHROSCOPY DSU (CUSTOM PROCEDURE TRAY) ×3 IMPLANT
PAD ARMBOARD 7.5X6 YLW CONV (MISCELLANEOUS) ×6 IMPLANT
PAD CAST 4YDX4 CTTN HI CHSV (CAST SUPPLIES) ×1 IMPLANT
PAD COLD SHLDR WRAP-ON (PAD) ×3 IMPLANT
PADDING CAST COTTON 4X4 STRL (CAST SUPPLIES) ×2
PADDING CAST COTTON 6X4 STRL (CAST SUPPLIES) ×5 IMPLANT
PATELLA BONE HEMI QUADRICEP 45 (Bone Implant) ×2 IMPLANT
PENCIL BUTTON HOLSTER BLD 10FT (ELECTRODE) IMPLANT
SCREW INTERFERENCE FT BC 10X20 (Screw) ×4 IMPLANT
SCREW INTERFERENCE FT BC 9X20 (Screw) ×2 IMPLANT
SPONGE T-LAP 4X18 ~~LOC~~+RFID (SPONGE) ×6 IMPLANT
SUCTION FRAZIER HANDLE 10FR (MISCELLANEOUS) ×2
SUCTION TUBE FRAZIER 10FR DISP (MISCELLANEOUS) ×1 IMPLANT
SUT 2 FIBERLOOP 20 STRT BLUE (SUTURE)
SUT ETHILON 3 0 PS 1 (SUTURE) ×6 IMPLANT
SUT MNCRL AB 4-0 PS2 18 (SUTURE) ×6 IMPLANT
SUT VIC AB 0 CT1 27 (SUTURE) ×2
SUT VIC AB 0 CT1 27XBRD ANBCTR (SUTURE) ×1 IMPLANT
SUT VIC AB 2-0 CT1 27 (SUTURE) ×2
SUT VIC AB 2-0 CT1 TAPERPNT 27 (SUTURE) ×1 IMPLANT
SUT VICRYL 0 AB UR-6 (SUTURE) ×9 IMPLANT
SUTURE 2 FIBERLOOP 20 STRT BLU (SUTURE) IMPLANT
SUTURE TAPE 1.3 40 TPR END (SUTURE) IMPLANT
SUTURETAPE 1.3 40 TPR END (SUTURE) ×3
SYR 30ML LL (SYRINGE) ×3 IMPLANT
SYR BULB IRRIG 60ML STRL (SYRINGE) ×3 IMPLANT
SYR TB 1ML LUER SLIP (SYRINGE) ×3 IMPLANT
TOWEL GREEN STERILE (TOWEL DISPOSABLE) ×6 IMPLANT
TOWEL GREEN STERILE FF (TOWEL DISPOSABLE) ×3 IMPLANT
TUBING ARTHROSCOPY IRRIG 16FT (MISCELLANEOUS) ×3 IMPLANT
UNDERPAD 30X36 HEAVY ABSORB (UNDERPADS AND DIAPERS) ×3 IMPLANT
WATER STERILE IRR 1000ML POUR (IV SOLUTION) ×3 IMPLANT

## 2022-01-13 NOTE — Transfer of Care (Signed)
Immediate Anesthesia Transfer of Care Note  Patient: Alicia Green  Procedure(s) Performed: left knee anterior cruciate ligament reconstruction with bone patella tendon bone allograft; meniscal debridement  arthroscopy (Left: Knee)  Patient Location: PACU  Anesthesia Type:GA combined with regional for post-op pain  Level of Consciousness: awake, alert  and oriented  Airway & Oxygen Therapy: Patient Spontanous Breathing  Post-op Assessment: Report given to RN and Post -op Vital signs reviewed and stable  Post vital signs: Reviewed and stable  Last Vitals:  Vitals Value Taken Time  BP 114/69 01/13/22 1038  Temp    Pulse 79 01/13/22 1039  Resp 28 01/13/22 1039  SpO2 98 % 01/13/22 1039  Vitals shown include unvalidated device data.  Last Pain:  Vitals:   01/13/22 0639  TempSrc:   PainSc: 0-No pain         Complications: No notable events documented.

## 2022-01-13 NOTE — Anesthesia Procedure Notes (Signed)
Anesthesia Procedure Image    

## 2022-01-13 NOTE — Op Note (Signed)
NAME: Alicia Green, Alicia Green MEDICAL RECORD NO: [id number] ACCOUNT NO: [id number] DATE OF BIRTH: 1956/05/18 FACILITY: [facility] LOCATION: [facility] PHYSICIAN: Yetta Barre. Marlou Sa, MD  Operative Report   DATE OF PROCEDURE: 01/13/2022  PREOPERATIVE DIAGNOSIS:  Left knee anterior cruciate ligament tear and posterior horn medial meniscal tear.  POSTOPERATIVE DIAGNOSES:  Left knee anterior cruciate ligament tear and posterior horn medial meniscal tear.  PROCEDURE:  Left knee arthroscopy with partial medial meniscectomy and bone-patellar tendon-bone allograft, ACL reconstruction with BMAC augmentation of the graft.  SURGEON:  Yetta Barre. Marlou Sa, MD  ASSISTANT:  Annie Main, PA.  INDICATIONS:  The patient is a 65 year old patient who injured her knee skiing, presents now for operative management after explanation of risks and benefits.  DESCRIPTION OF PROCEDURE:  The patient was brought to the operating room where general anesthetic was induced.  Preoperative antibiotics were administered.  Timeout was called.  Left leg pre-scrubbed with alcohol and Betadine including the left hip.   Prepped with DuraPrep solution and draped in a sterile manner.  Examination of the left knee demonstrated ACL laxity with positive Lachman, positive anterior drawer, but no posterolateral rotatory instability.  The patient did have full extension and  full flexion, although with full flexion, the last 10 degrees there was a little bit of crepitus consistent with possible residual scar tissue from her injury.  The timeout was called.  The harvesting needle was placed into the anterior superior iliac  crest between the two tables about 4 cm proximal to the anterior superior iliac crest.  Bone marrow aspirate was harvested 60 mL x2. This small incision was irrigated and anesthetized using Marcaine, morphine, clonidine, and closed using two 3-0 Vicryl  sutures.  Impervious dressing applied.  Prepared on the back table for the [facility]  injection into the graft to facilitate incorporation.  Next, the graft was prepared on the back table by [facility] to a 9.5 bone tunnels by 20.  Next, anterior  inferolateral and anterior inferomedial portals were established.  Diagnostic arthroscopy was performed.  Patellofemoral compartment was intact with mild grade I changes within the center of the trochlea.  No loose bodies in the medial or lateral gutter.   ACL was torn midsubstance was debrided and a notchplasty performed with a bur.  Bone quality was marginal.  Following medial meniscal debridement the lateral compartment was inspected.  Articular cartilage intact with intact lateral meniscus.  On the  medial side, there were some grade I-II changes over about 25% of the weightbearing surface area of that medial femoral condyle.  Following meniscal debridement and notchplasty, the tibial tunnel was drilled with a 10 mm acorn reamer and the posterior  aspect of the native ACL footprint.  Next, a Beath pin was placed with a 2 mm offset guide into the 1:30 position on the lateral femoral condyle.  At that time, footprinting with the 10 mm acorn reamer was performed and the back wall was very stable.   Next, both tunnels in the tibia and femur were approximately 30 mm.  Graft was then passed.  Initially 9 x 20 mm interference screw was used in the femoral tunnel, but that pulled out with testing of the graft.  This was removed.  A 10 mm interference  screw was placed, which gave very good fixation.  This was supplemented proximally by placing the 2 suture tapes in the femoral bone plug into a SwiveLock.  This was facilitated through an exposure through the iliotibial band and blunt dissection through  the lateral quadriceps region.  Secure fixation was achieved with the PushLock by using a 4 mm drill instead of the 4.5 mm drill.  On the tibial side, the knee was taken through range of motion, found to have no graft impingement with good stability.    The 10 mm interference screw was placed with good fixation achieved.  This was done in full extension.  Supplemental fixation also performed on the tibial side using a SwiveLock with the same parameters utilized.  All in all, a very stable knee was  achieved.  Thorough irrigation performed on both incisions.  Vancomycin, which was used around the graft after placing BMAC into the substance of the graft was also utilized.  Thorough irrigation performed in the joint.  The incisions on the tibia and  femur were closed using 0 Vicryl suture, 2-0 Vicryl suture, and 3-0 nylon.  The portal incisions were closed using 2-0 Vicryl and 3-0 nylon.  A solution of Marcaine, morphine, clonidine used to anesthetize the incisions as well as the knee joint.   Impervious dressing was placed along with a knee immobilizer.  Iceman along with a bulky dressing, iceman and a knee immobilizer.  The patient tolerated the procedure well without immediate complication and was transferred to the recovery room in stable  condition.  Luke's assistance was required for graft preparation.  He did cut the bone blocks down to 9.5 mm bone blocks and placed 2 suture tapes into each bone block to facilitate backup fixation as well as graft passage.  His assistance was a medical  necessity.   PUS D: 01/13/2022 10:47:04 am T: 01/13/2022 2:13:00 pm  JOB: [id number]/ [id number]

## 2022-01-13 NOTE — Anesthesia Procedure Notes (Signed)
Procedure Name: LMA Insertion Date/Time: 01/13/2022 7:40 AM Performed by: Minerva Ends, CRNA Pre-anesthesia Checklist: Patient identified, Emergency Drugs available, Suction available and Patient being monitored Patient Re-evaluated:Patient Re-evaluated prior to induction Oxygen Delivery Method: Circle system utilized Preoxygenation: Pre-oxygenation with 100% oxygen Induction Type: IV induction Ventilation: Mask ventilation without difficulty LMA: LMA inserted LMA Size: 3.0 Tube type: Oral Number of attempts: 1 Placement Confirmation: positive ETCO2 and breath sounds checked- equal and bilateral Tube secured with: Tape Dental Injury: Teeth and Oropharynx as per pre-operative assessment

## 2022-01-13 NOTE — Anesthesia Procedure Notes (Signed)
Anesthesia Regional Block: Popliteal block   Pre-Anesthetic Checklist: , timeout performed,  Correct Patient, Correct Site, Correct Laterality,  Correct Procedure, Correct Position, site marked,  Risks and benefits discussed,  Surgical consent,  Pre-op evaluation,  At surgeon's request and post-op pain management  Laterality: Left  Prep: chloraprep       Needles:  Injection technique: Single-shot  Needle Type: Echogenic Needle     Needle Length: 9cm      Additional Needles:   Procedures:,,,, ultrasound used (permanent image in chart),,    Narrative:  Start time: 01/13/2022 6:52 AM End time: 01/13/2022 7:01 AM Injection made incrementally with aspirations every 5 mL.  Performed by: Personally  Anesthesiologist: Myrtie Soman, MD  Additional Notes: Patient tolerated the procedure well without complications

## 2022-01-13 NOTE — Anesthesia Procedure Notes (Signed)
Anesthesia Regional Block: Adductor canal block   Pre-Anesthetic Checklist: , timeout performed,  Correct Patient, Correct Site, Correct Laterality,  Correct Procedure, Correct Position, site marked,  Risks and benefits discussed,  Surgical consent,  Pre-op evaluation,  At surgeon's request and post-op pain management  Laterality: Left  Prep: chloraprep       Needles:  Injection technique: Single-shot  Needle Type: Echogenic Needle     Needle Length: 9cm      Additional Needles:   Procedures:,,,, ultrasound used (permanent image in chart),,    Narrative:  Start time: 01/13/2022 7:02 AM End time: 01/13/2022 7:06 AM Injection made incrementally with aspirations every 5 mL.  Performed by: Personally  Anesthesiologist: Myrtie Soman, MD  Additional Notes: Patient tolerated the procedure well without complications

## 2022-01-13 NOTE — Brief Op Note (Signed)
° °  01/13/2022  10:39 AM  PATIENT:  Alicia Green  66 y.o. female  PRE-OPERATIVE DIAGNOSIS:  left knee anterior cruciate ligament tear, meniscal tear medial  POST-OPERATIVE DIAGNOSIS:  eft knee anterior cruciate ligament tear, meniscal tear medial  PROCEDURE:  Procedure(s): left knee anterior cruciate ligament reconstruction with bone patella tendon bone allograft; meniscal debridement  medial witharthroscopy  SURGEON:  Surgeon(s): Meredith Pel, MD  ASSISTANT: magnant pa  ANESTHESIA:   general  EBL: 10 ml    Total I/O In: 700 [I.V.:700] Out: 50 [Blood:50]  BLOOD ADMINISTERED: none  DRAINS: none   LOCAL MEDICATIONS USED: Marcaine morphine clonidine  SPECIMEN:  No Specimen  COUNTS:  YES  TOURNIQUET:  * No tourniquets in log *  DICTATION: .[id number] PLAN OF CARE: Discharge to home after PACU  PATIENT DISPOSITION:  PACU - hemodynamically stable

## 2022-01-13 NOTE — H&P (Signed)
Alicia Green is an 66 y.o. female.   Chief Complaint: Left knee pain HPI: Alicia Green is a 66 year old patient with left knee pain.  She sustained an injury several months ago and has had symptomatic instability since that time.  MRI scan consistent with ACL tear along with medial meniscal tear.  No personal or family history of DVT or pulmonary embolism.  Patient is very active and would like to remain active with a stable knee.  Past Medical History:  Diagnosis Date   High vitamin D level    Macular degeneration    Migraine headache    MVP (mitral valve prolapse)    Osteoporosis    Protrusion of cervical intervertebral disc    PVC (premature ventricular contraction)    Right shoulder pain    Sinusitis    Vaginal discomfort    Varicose veins of right lower extremity with pain     Past Surgical History:  Procedure Laterality Date   APPENDECTOMY     BREAST LUMPECTOMY     CESAREAN SECTION     KNEE ARTHROSCOPY Left    WRIST SURGERY Left 05/2021   ORIF    Family History  Problem Relation Age of Onset   Alzheimer's disease Mother    Hypertension Father    Cancer Father        prostate   Bipolar disorder Sister    Kidney disease Sister    Hepatitis Brother    Social History:  reports that she has never smoked. She has never used smokeless tobacco. She reports that she does not drink alcohol and does not use drugs.  Allergies:  Allergies  Allergen Reactions   Black Walnut Pollen Allergy Skin Test Nausea Only    migraines   Amoxicillin Nausea Only   [place] Wax     Migraines    Chocolate     migraines   Gluten Meal     migraines   Iodine     Prolonged exposure causes itching   Latex     Prolonged exposure causes itching/rash   Oxycodone-Acetaminophen Nausea And Vomiting   Potassium Hydroxide     Migraines    Sodium Magnesium Aluminosilicate [Silicic Acid]     Migraines    Sodium Starch Glycolate [Sodium]     Migraines    Sorbitan     Sorbitol/sorbitan migraines    Sulfa Antibiotics Itching   Sulfate     migraines    Medications Prior to Admission  Medication Sig Dispense Refill   ASHWAGANDHA PO Take 1 tablet by mouth daily.     GLUCOSAMINE-CHONDROITIN PO Take 2 tablets by mouth daily.     Glutamine POWD Take 1 Dose by mouth daily. 1 dose = 1 teaspoonful     GLUTATHIONE PO Take 1 tablet by mouth daily.     Homeopathic Products (ARNICA EX) Apply 1 application topically daily as needed (pain).     ibuprofen (ADVIL) 200 MG tablet Take 200-400 mg by mouth every 6 (six) hours as needed for moderate pain or headache.     Magnesium Hydroxide (MAGNESIA PO) Take 1 tablet by mouth every other day.     Multiple Vitamins-Minerals (PRESERVISION AREDS 2 PO) Take 2 tablets by mouth daily.     Omega-3 Fatty Acids (FISH OIL PO) Take 1 capsule by mouth daily.     OVER THE COUNTER MEDICATION Take 1 tablet by mouth daily. Baicalin otc supplement     OVER THE COUNTER MEDICATION Take 1 tablet by mouth daily.  Arterosil otc supplement     OVER THE COUNTER MEDICATION Apply 1 application topically 2 (two) times daily as needed (pain). CBD liquid     risedronate (ACTONEL) 150 MG tablet Take 150 mg by mouth every 30 (thirty) days.     TURMERIC CURCUMIN PO Take 1 tablet by mouth daily.      No results found for this or any previous visit (from the past 48 hour(s)). No results found.  Review of Systems  Musculoskeletal:  Positive for arthralgias.  All other systems reviewed and are negative.  Blood pressure (!) 155/74, pulse 66, temperature 98.1 F (36.7 C), temperature source Oral, resp. rate 17, height 5\' 6"  (1.676 m), weight 56.7 kg, SpO2 97 %. Physical Exam Vitals reviewed.  HENT:     Head: Normocephalic.     Nose: Nose normal.  Eyes:     Pupils: Pupils are equal, round, and reactive to light.  Cardiovascular:     Rate and Rhythm: Normal rate.     Pulses: Normal pulses.  Pulmonary:     Effort: Pulmonary effort is normal.  Abdominal:     General: Abdomen  is flat.  Musculoskeletal:     Cervical back: Normal range of motion.  Skin:    General: Skin is warm.     Capillary Refill: Capillary refill takes less than 2 seconds.  Neurological:     General: No focal deficit present.     Mental Status: She is alert.  Psychiatric:        Mood and Affect: Mood normal.    Ortho exam demonstrates left knee with 0 degrees extension and 120 degrees of knee flexion.  No calf tenderness.  Negative Homans' sign.  Able to perform straight leg raise with excellent quad strength rated 5/5.  No significant effusion noted.  Laxity on anterior drawer.  No posterior lateral rotatory instability noted.  Pedal pulses palpable.  Ankle dorsiflexion intact.  Assessment/Plan  Impression is active 66 year old patient with left knee pain and instability.  MRI scan is reviewed and it does show ACL tear with possible undersurface tear of the posterior horn of that medial meniscus.  Although the patient is 33 she does seem much younger in terms of her physical activity and demands with cutting and pivoting sports that she plans to continue.  We discussed operative and nonoperative treatment options.  In general operative treatment would give her best chance at a stable knee.  We discussed allograft versus autograft as well as donor site morbidity and return to function.  In her case I think and injected bone patella tendon bone allograft is her best option.  Could inject BMA/PRP into the allograft tendon to allow for quicker revascularization and incorporation.  Prerehabilitation prescription written today.  Return in 3 weeks at which time we will likely schedule her for surgery.  She does not quite have enough flexion yet to schedule at this time.  I do think with another 2 to 3 weeks of pretty rehabilitation she would be an optimal condition for surgery.  Anderson Malta, MD 01/13/2022, 6:41 AM

## 2022-01-14 ENCOUNTER — Encounter: Payer: Self-pay | Admitting: Orthopedic Surgery

## 2022-01-14 ENCOUNTER — Other Ambulatory Visit: Payer: Self-pay | Admitting: Surgical

## 2022-01-14 ENCOUNTER — Encounter (HOSPITAL_COMMUNITY): Payer: Self-pay | Admitting: Orthopedic Surgery

## 2022-01-14 MED ORDER — TRAMADOL HCL 50 MG PO TABS: 100.0000 mg | ORAL_TABLET | Freq: Four times a day (QID) | ORAL | 0 refills | Status: AC | PRN

## 2022-01-14 NOTE — Anesthesia Postprocedure Evaluation (Signed)
Anesthesia Post Note  Patient: Alicia Green  Procedure(s) Performed: left knee anterior cruciate ligament reconstruction with bone patella tendon bone allograft; meniscal debridement  arthroscopy (Left: Knee)     Patient location during evaluation: PACU Anesthesia Type: General Level of consciousness: awake and alert Pain management: pain level controlled Vital Signs Assessment: post-procedure vital signs reviewed and stable Respiratory status: spontaneous breathing, nonlabored ventilation, respiratory function stable and patient connected to nasal cannula oxygen Cardiovascular status: blood pressure returned to baseline and stable Postop Assessment: no apparent nausea or vomiting Anesthetic complications: no   No notable events documented.  Last Vitals:  Vitals:   01/13/22 1107 01/13/22 1122  BP: 107/69 119/75  Pulse: 72 64  Resp: 18 18  Temp:  36.7 C  SpO2: 96% 100%    Last Pain:  Vitals:   01/13/22 1122  TempSrc:   PainSc: 3                  Elleanna Melling S

## 2022-01-15 ENCOUNTER — Telehealth: Payer: Self-pay

## 2022-01-15 ENCOUNTER — Other Ambulatory Visit: Payer: Self-pay

## 2022-01-15 ENCOUNTER — Encounter: Payer: Self-pay | Admitting: Orthopedic Surgery

## 2022-01-15 MED ORDER — ONDANSETRON HCL 4 MG PO TABS: 4.0000 mg | ORAL_TABLET | Freq: Three times a day (TID) | ORAL | 0 refills | Status: AC | PRN

## 2022-01-15 NOTE — Telephone Encounter (Signed)
Called her. She has no red flag sx's and is still urinating well and had BM this morning. No abd pain, chest pain, sob, fevers, chills, draiange. Going to try zofran and call office later if no help

## 2022-01-15 NOTE — Telephone Encounter (Signed)
IC and talked with patients husband Bruce in response to [employer]. Advised should hold off on pain meds if not having any pain, should continue with the zofran.

## 2022-01-15 NOTE — Telephone Encounter (Signed)
Patients husband called again about patients nausea and vomiting.  I spoke with him at length. He stated this started yesterday afternoon and has continued through the night and morning. Has not been able to keep anything down-even ice chips. Denies any specific stomach pain other than the nausea, had bowel movement this morning, no fever, has not been exposed to anyone with GI bug.  They wonder if related to medication? I sent in zofran but they stated in the past she had not been able to keep anti-nausea pills down. They are requesting a nausea patch? Also requesting that one of you please call them to discuss.

## 2022-01-21 ENCOUNTER — Ambulatory Visit (INDEPENDENT_AMBULATORY_CARE_PROVIDER_SITE_OTHER): Payer: Medicare PPO | Admitting: Surgical

## 2022-01-21 ENCOUNTER — Other Ambulatory Visit: Payer: Self-pay

## 2022-01-21 DIAGNOSIS — Z9889 Other specified postprocedural states: Secondary | ICD-10-CM

## 2022-01-23 ENCOUNTER — Encounter: Payer: Self-pay | Admitting: Orthopedic Surgery

## 2022-01-23 NOTE — Progress Notes (Signed)
Post-Op Visit Note   Patient: Alicia Green           Date of Birth: Aug 25, 1956           MRN: [id number] Visit Date: 01/21/2022 PCP: Cari Caraway, MD   Assessment & Plan:  Chief Complaint:  Chief Complaint  Patient presents with   Left Knee - Routine Post Op    left knee anterior cruciate ligament reconstruction with BTB allograft and meniscal debridement versus repair on 01/13/2022   Visit Diagnoses:  1. S/P ACL reconstruction     Plan: Patient is a 66 year old female who presents s/p left knee ACL reconstruction with BTB allograft, augmented with bone marrow aspirate from iliac crest on 01/13/2022.  She is doing well overall with no pain.  She had severe nausea and vomiting for several days after surgery but that has resolved.  She denies any abdominal pain, difficulty moving her bowels, other complaints.  She is full weightbearing with crutches and a Bledsoe brace.  She is not using a CPM machine but she has already been set up for physical therapy and was doing rehab prior to surgery.  Overall she is doing very well with no complaint of fever, chills, night sweats, pain.  Denies any chest pain or shortness of breath or calf pain.  Taking aspirin daily for DVT prophylaxis.  On exam she has 0 degrees extension and 70 degrees of knee flexion.  Incisions are healing well.  There is some erythema noted proximal and lateral to the tibial incision that does not appear to be cellulitis.  She has no significant effusion.  She is able to perform straight leg raise without any extensor lag.  Excellent quad strength rated 5/5.  ACL graft is stable on Lachman exam and by anterior drawer.  No calf tenderness.  Negative Homans' sign.  Patient appears well and does not appear sick.  Plan is to continue with physical therapy.  She may weight-bear fully and wean out of the crutches over the next week to 2 weeks with her excellent quad strength that she demonstrated on exam today.  Follow-up with the  office in 3 weeks.  Call the office with any concerns in the meantime.  She may discontinue aspirin when she is back to her normal level of mobility but for now I would recommend she continue using it.  Follow-Up Instructions: No follow-ups on file.   Orders:  No orders of the defined types were placed in this encounter.  No orders of the defined types were placed in this encounter.   Imaging: No results found.  PMFS History: Patient Active Problem List   Diagnosis Date Noted   Intermediate stage nonexudative age-related macular degeneration of both eyes 09/03/2020   Nuclear sclerotic cataract of right eye 09/03/2020   Other vitreous opacities, right eye 09/03/2020   Nuclear sclerotic cataract of left eye 09/03/2020   Past Medical History:  Diagnosis Date   High vitamin D level    Macular degeneration    Migraine headache    MVP (mitral valve prolapse)    Osteoporosis    Protrusion of cervical intervertebral disc    PVC (premature ventricular contraction)    Right shoulder pain    Sinusitis    Vaginal discomfort    Varicose veins of right lower extremity with pain     Family History  Problem Relation Age of Onset   Alzheimer's disease Mother    Hypertension Father    Cancer Father  prostate   Bipolar disorder Sister    Kidney disease Sister    Hepatitis Brother     Past Surgical History:  Procedure Laterality Date   ANTERIOR CRUCIATE LIGAMENT REPAIR Left 01/13/2022   Procedure: left knee anterior cruciate ligament reconstruction with bone patella tendon bone allograft; meniscal debridement  arthroscopy;  Surgeon: Meredith Pel, MD;  Location: [place];  Service: Orthopedics;  Laterality: Left;   APPENDECTOMY     BREAST LUMPECTOMY     CESAREAN SECTION     KNEE ARTHROSCOPY Left    WRIST SURGERY Left 05/2021   ORIF   Social History   Occupational History   Not on file  Tobacco Use   Smoking status: Never   Smokeless tobacco: Never  Vaping Use    Vaping Use: Never used  Substance and Sexual Activity   Alcohol use: No   Drug use: Never   Sexual activity: Not on file

## 2022-01-24 DIAGNOSIS — S83242A Other tear of medial meniscus, current injury, left knee, initial encounter: Secondary | ICD-10-CM

## 2022-01-24 DIAGNOSIS — S83512A Sprain of anterior cruciate ligament of left knee, initial encounter: Secondary | ICD-10-CM

## 2022-01-28 ENCOUNTER — Encounter: Payer: Self-pay | Admitting: Orthopedic Surgery

## 2022-02-11 ENCOUNTER — Encounter: Payer: Self-pay | Admitting: Orthopedic Surgery

## 2022-02-11 ENCOUNTER — Other Ambulatory Visit: Payer: Self-pay

## 2022-02-11 ENCOUNTER — Ambulatory Visit (INDEPENDENT_AMBULATORY_CARE_PROVIDER_SITE_OTHER): Payer: Medicare PPO | Admitting: Orthopedic Surgery

## 2022-02-11 DIAGNOSIS — Z9889 Other specified postprocedural states: Secondary | ICD-10-CM

## 2022-02-11 MED ORDER — ENOVARX-BACLOFEN 1 % EX CREA: TOPICAL_CREAM | CUTANEOUS | 1 refills | Status: AC

## 2022-02-11 NOTE — Progress Notes (Signed)
Post-Op Visit Note   Patient: Alicia Green           Date of Birth: [birth year]           MRN: [id number] Visit Date: 02/11/2022 PCP: Cari Caraway, MD   Assessment & Plan:  Chief Complaint:  Chief Complaint  Patient presents with   Left Knee - Follow-up      left knee anterior cruciate ligament reconstruction with BTB allograft and meniscal debridement versus repair on 01/13/2022      Visit Diagnoses:  1. S/P ACL reconstruction     Plan: Alicia Green is a 66 year old patient is now about a month out left knee ACL reconstruction with bone patella tendon bone allograft with meniscal debridement.  She is in physical therapy doing a home exercise program.  On exam she has range of motion of 5-1 10 with good stability to anterior drawer and Lachman.  No calf tenderness with negative Homans.  Plan is baclofen cream apply daily x30 days to neck.  Okay for pool.  Continue with strengthening and range of motion exercises.  Follow-up in 4 weeks.  Follow-Up Instructions: Return in about 4 weeks (around 03/11/2022).   Orders:  No orders of the defined types were placed in this encounter.  Meds ordered this encounter  Medications   EnovaRX-Baclofen 1 % CREA    Sig: Apply q d x 30 days prn    Dispense:  30 g    Refill:  1    Imaging: No results found.  PMFS History: Patient Active Problem List   Diagnosis Date Noted   Left anterior cruciate ligament tear    Acute medial meniscus tear of left knee    Intermediate stage nonexudative age-related macular degeneration of both eyes 09/03/2020   Nuclear sclerotic cataract of right eye 09/03/2020   Other vitreous opacities, right eye 09/03/2020   Nuclear sclerotic cataract of left eye 09/03/2020   Past Medical History:  Diagnosis Date   High vitamin D level    Macular degeneration    Migraine headache    MVP (mitral valve prolapse)    Osteoporosis    Protrusion of cervical intervertebral disc    PVC (premature ventricular  contraction)    Right shoulder pain    Sinusitis    Vaginal discomfort    Varicose veins of right lower extremity with pain     Family History  Problem Relation Age of Onset   Alzheimer's disease Mother    Hypertension Father    Cancer Father        prostate   Bipolar disorder Sister    Kidney disease Sister    Hepatitis Brother     Past Surgical History:  Procedure Laterality Date   ANTERIOR CRUCIATE LIGAMENT REPAIR Left 01/13/2022   Procedure: left knee anterior cruciate ligament reconstruction with bone patella tendon bone allograft; meniscal debridement  arthroscopy;  Surgeon: Meredith Pel, MD;  Location: [place];  Service: Orthopedics;  Laterality: Left;   APPENDECTOMY     BREAST LUMPECTOMY     CESAREAN SECTION     KNEE ARTHROSCOPY Left    WRIST SURGERY Left 05/2021   ORIF   Social History   Occupational History   Not on file  Tobacco Use   Smoking status: Never   Smokeless tobacco: Never  Vaping Use   Vaping Use: Never used  Substance and Sexual Activity   Alcohol use: No   Drug use: Never   Sexual activity: Not on file

## 2022-03-11 ENCOUNTER — Ambulatory Visit (INDEPENDENT_AMBULATORY_CARE_PROVIDER_SITE_OTHER): Payer: Medicare PPO | Admitting: Surgical

## 2022-03-11 ENCOUNTER — Other Ambulatory Visit: Payer: Self-pay

## 2022-03-11 DIAGNOSIS — Z9889 Other specified postprocedural states: Secondary | ICD-10-CM

## 2022-03-14 ENCOUNTER — Encounter: Payer: Self-pay | Admitting: Orthopedic Surgery

## 2022-03-14 NOTE — Progress Notes (Signed)
? ?Post-Op Visit Note ?  ?Patient: Alicia Green           ?Date of Birth: [birth year]           ?MRN: [id number] ?Visit Date: 03/11/2022 ?PCP: Cari Caraway, MD ? ? ?Assessment & Plan: ? ?Chief Complaint:  ?Chief Complaint  ?Patient presents with  ? Post-op Follow-up  ?  01/13/22 (8w 1d) left knee anterior cruciate ligament reconstruction with bone patella tendon bone allograft; meniscal debridement  arthroscopy - Left ? ?  ? ?Visit Diagnoses:  ?1. S/P ACL reconstruction   ? ? ? ?Plan: Patient is a 66 year old female who presents s/p left knee ACL reconstruction with BTB allograft, augmented with bone marrow aspirate from iliac crest on 01/13/2022.  She is doing well overall with no pain.  Currently working with physical therapy.  She is doing pool exercises, walking for 25 minutes, doing his stationary bike for 20 minutes.  Working on balance exercises in the pool as well.  She denies any mechanical symptoms.  No instability of the operative knee.  She is walking with a cane that she only really uses to optimize her gait.  She feels she can walk well without it but her gait suffers if she does not use it. ? ?On exam she has 2 degrees extension and 120 degrees of knee flexion.  Incisions are healing well.  She has no significant effusion.  She is able to perform straight leg raise without any extensor lag.  Excellent quad and hamstring strength rated 5/5.  ACL graft is stable on Lachman exam and by anterior drawer.  No calf tenderness.  Negative Homans' sign.  She ambulates without any significant antalgia ? ?Plan is to continue with physical therapy.  She is okay to continue stationary bike and okay to walk on flat ground for as long as she can tolerate until her knee feels more achy.  She will try an increase in her walking endurance over the next couple months.  Follow-up in 2 months for clinical recheck with Dr. Marlou Sa.  At that point, the ACL graft should be fairly well incorporated and vascularized to the point  that she can increase her activity level to some degree.  ? ?Follow-Up Instructions: No follow-ups on file.  ? ?Orders:  ?No orders of the defined types were placed in this encounter. ? ?No orders of the defined types were placed in this encounter. ? ? ?Imaging: ?No results found. ? ?PMFS History: ?Patient Active Problem List  ? Diagnosis Date Noted  ? Left anterior cruciate ligament tear   ? Acute medial meniscus tear of left knee   ? Intermediate stage nonexudative age-related macular degeneration of both eyes 09/03/2020  ? Nuclear sclerotic cataract of right eye 09/03/2020  ? Other vitreous opacities, right eye 09/03/2020  ? Nuclear sclerotic cataract of left eye 09/03/2020  ? ?Past Medical History:  ?Diagnosis Date  ? High vitamin D level   ? Macular degeneration   ? Migraine headache   ? MVP (mitral valve prolapse)   ? Osteoporosis   ? Protrusion of cervical intervertebral disc   ? PVC (premature ventricular contraction)   ? Right shoulder pain   ? Sinusitis   ? Vaginal discomfort   ? Varicose veins of right lower extremity with pain   ?  ?Family History  ?Problem Relation Age of Onset  ? Alzheimer's disease Mother   ? Hypertension Father   ? Cancer Father   ?  prostate  ? Bipolar disorder Sister   ? Kidney disease Sister   ? Hepatitis Brother   ?  ?Past Surgical History:  ?Procedure Laterality Date  ? ANTERIOR CRUCIATE LIGAMENT REPAIR Left 01/13/2022  ? Procedure: left knee anterior cruciate ligament reconstruction with bone patella tendon bone allograft; meniscal debridement  arthroscopy;  Surgeon: Meredith Pel, MD;  Location: [facility];  Service: Orthopedics;  Laterality: Left;  ? APPENDECTOMY    ? BREAST LUMPECTOMY    ? CESAREAN SECTION    ? KNEE ARTHROSCOPY Left   ? WRIST SURGERY Left 05/2021  ? ORIF  ? ?Social History  ? ?Occupational History  ? Not on file  ?Tobacco Use  ? Smoking status: Never  ? Smokeless tobacco: Never  ?Vaping Use  ? Vaping Use: Never used  ?Substance and Sexual Activity  ?  Alcohol use: No  ? Drug use: Never  ? Sexual activity: Not on file  ? ? ? ?

## 2022-05-13 ENCOUNTER — Ambulatory Visit: Payer: Medicare PPO | Admitting: Orthopedic Surgery

## 2022-05-13 ENCOUNTER — Encounter: Payer: Self-pay | Admitting: Orthopedic Surgery

## 2022-05-13 DIAGNOSIS — Z9889 Other specified postprocedural states: Secondary | ICD-10-CM

## 2022-05-13 NOTE — Progress Notes (Signed)
   Post-Op Visit Note   Patient: Alicia Green           Date of Birth: August 12, 1956           MRN: [id number] Visit Date: 05/13/2022 PCP: Cari Caraway, MD   Assessment & Plan:  Chief Complaint:  Chief Complaint  Patient presents with   Left Knee - Routine Post Op   Visit Diagnoses:  1. S/P ACL reconstruction     Plan: [name] is a 66 year old patient is now 4 months out left knee ACL reconstruction bone patella tendon bone with partial medial meniscectomy.  Doing some pool walking and indoor cycling.  Overall she has been doing well.  States that it feels a little tight after walking.  Not taking any medications for her knee.  On examination she has just trace effusion with full range of motion and very good quad and hamstring strength.  Plan at this time is physical therapy once every 2 weeks for 12 weeks to work on starting lateral movement.  She does like to do racquetball and skiing.  4 months return for clinical recheck and full release.  Follow-Up Instructions: Return in about 4 months (around 09/13/2022).   Orders:  No orders of the defined types were placed in this encounter.  No orders of the defined types were placed in this encounter.   Imaging: No results found.  PMFS History: Patient Active Problem List   Diagnosis Date Noted   Left anterior cruciate ligament tear    Acute medial meniscus tear of left knee    Intermediate stage nonexudative age-related macular degeneration of both eyes 09/03/2020   Nuclear sclerotic cataract of right eye 09/03/2020   Other vitreous opacities, right eye 09/03/2020   Nuclear sclerotic cataract of left eye 09/03/2020   Past Medical History:  Diagnosis Date   High vitamin D level    Macular degeneration    Migraine headache    MVP (mitral valve prolapse)    Osteoporosis    Protrusion of cervical intervertebral disc    PVC (premature ventricular contraction)    Right shoulder pain    Sinusitis    Vaginal discomfort     Varicose veins of right lower extremity with pain     Family History  Problem Relation Age of Onset   Alzheimer's disease Mother    Hypertension Father    Cancer Father        prostate   Bipolar disorder Sister    Kidney disease Sister    Hepatitis Brother     Past Surgical History:  Procedure Laterality Date   ANTERIOR CRUCIATE LIGAMENT REPAIR Left 01/13/2022   Procedure: left knee anterior cruciate ligament reconstruction with bone patella tendon bone allograft; meniscal debridement  arthroscopy;  Surgeon: Meredith Pel, MD;  Location: [facility];  Service: Orthopedics;  Laterality: Left;   APPENDECTOMY     BREAST LUMPECTOMY     CESAREAN SECTION     KNEE ARTHROSCOPY Left    WRIST SURGERY Left 05/2021   ORIF   Social History   Occupational History   Not on file  Tobacco Use   Smoking status: Never   Smokeless tobacco: Never  Vaping Use   Vaping Use: Never used  Substance and Sexual Activity   Alcohol use: No   Drug use: Never   Sexual activity: Not on file

## 2022-05-22 ENCOUNTER — Encounter: Payer: Self-pay | Admitting: Orthopedic Surgery

## 2022-06-02 DIAGNOSIS — M25562 Pain in left knee: Secondary | ICD-10-CM | POA: Diagnosis not present

## 2022-06-02 DIAGNOSIS — S83512A Sprain of anterior cruciate ligament of left knee, initial encounter: Secondary | ICD-10-CM | POA: Diagnosis not present

## 2022-06-02 DIAGNOSIS — M25662 Stiffness of left knee, not elsewhere classified: Secondary | ICD-10-CM | POA: Diagnosis not present

## 2022-06-03 DIAGNOSIS — R600 Localized edema: Secondary | ICD-10-CM | POA: Diagnosis not present

## 2022-06-03 DIAGNOSIS — I872 Venous insufficiency (chronic) (peripheral): Secondary | ICD-10-CM | POA: Diagnosis not present

## 2022-06-29 DIAGNOSIS — S83512A Sprain of anterior cruciate ligament of left knee, initial encounter: Secondary | ICD-10-CM | POA: Diagnosis not present

## 2022-06-29 DIAGNOSIS — M25562 Pain in left knee: Secondary | ICD-10-CM | POA: Diagnosis not present

## 2022-06-29 DIAGNOSIS — M25662 Stiffness of left knee, not elsewhere classified: Secondary | ICD-10-CM | POA: Diagnosis not present

## 2022-08-18 DIAGNOSIS — M810 Age-related osteoporosis without current pathological fracture: Secondary | ICD-10-CM | POA: Diagnosis not present

## 2022-08-18 DIAGNOSIS — Z01419 Encounter for gynecological examination (general) (routine) without abnormal findings: Secondary | ICD-10-CM | POA: Diagnosis not present

## 2022-08-18 DIAGNOSIS — Z0000 Encounter for general adult medical examination without abnormal findings: Secondary | ICD-10-CM | POA: Diagnosis not present

## 2022-08-18 DIAGNOSIS — Z1331 Encounter for screening for depression: Secondary | ICD-10-CM | POA: Diagnosis not present

## 2022-08-18 DIAGNOSIS — Z6821 Body mass index (BMI) 21.0-21.9, adult: Secondary | ICD-10-CM | POA: Diagnosis not present

## 2022-08-25 DIAGNOSIS — Z1212 Encounter for screening for malignant neoplasm of rectum: Secondary | ICD-10-CM | POA: Diagnosis not present

## 2022-08-25 DIAGNOSIS — Z1211 Encounter for screening for malignant neoplasm of colon: Secondary | ICD-10-CM | POA: Diagnosis not present

## 2022-09-03 ENCOUNTER — Encounter (INDEPENDENT_AMBULATORY_CARE_PROVIDER_SITE_OTHER): Payer: Self-pay | Admitting: Ophthalmology

## 2022-09-03 ENCOUNTER — Ambulatory Visit (INDEPENDENT_AMBULATORY_CARE_PROVIDER_SITE_OTHER): Payer: Medicare PPO | Admitting: Ophthalmology

## 2022-09-03 DIAGNOSIS — H43391 Other vitreous opacities, right eye: Secondary | ICD-10-CM

## 2022-09-03 DIAGNOSIS — H2513 Age-related nuclear cataract, bilateral: Secondary | ICD-10-CM

## 2022-09-03 DIAGNOSIS — H2512 Age-related nuclear cataract, left eye: Secondary | ICD-10-CM | POA: Diagnosis not present

## 2022-09-03 DIAGNOSIS — H2511 Age-related nuclear cataract, right eye: Secondary | ICD-10-CM | POA: Diagnosis not present

## 2022-09-03 DIAGNOSIS — H353132 Nonexudative age-related macular degeneration, bilateral, intermediate dry stage: Secondary | ICD-10-CM

## 2022-09-03 NOTE — Progress Notes (Signed)
09/03/2022     CHIEF COMPLAINT Patient presents for  Chief Complaint  Patient presents with   Macular Degeneration      HISTORY OF PRESENT ILLNESS: Alicia Green is a 66 y.o. female who presents to the clinic today for:   HPI   Age related macular degeneration of both eyes 2 year fu ou oct Pt states her vision has been stable Pt denies floaters or FOL Pt states she notices that she needs more light to do thing around the house. Last edited by Morene Rankins, CMA on 09/03/2022  8:24 AM.      Referring physician: Lawerance Cruel, MD [place],  [place] [address]  HISTORICAL INFORMATION:   Selected notes from the [facility]: No current outpatient medications on file. (Ophthalmic Drugs)   No current facility-administered medications for this visit. (Ophthalmic Drugs)   Current Outpatient Medications (Other)  Medication Sig   ASHWAGANDHA PO Take 1 tablet by mouth daily.   EnovaRX-Baclofen 1 % CREA Apply q d x 30 days prn   gabapentin (NEURONTIN) 300 MG capsule Take 1 capsule (300 mg total) by mouth 3 (three) times daily.   GLUCOSAMINE-CHONDROITIN PO Take 2 tablets by mouth daily.   Glutamine POWD Take 1 Dose by mouth daily. 1 dose = 1 teaspoonful   GLUTATHIONE PO Take 1 tablet by mouth daily.   Homeopathic Products (ARNICA EX) Apply 1 application topically daily as needed (pain).   ibuprofen (ADVIL) 600 MG tablet Take 1 tablet (600 mg total) by mouth every 8 (eight) hours as needed.   Magnesium Hydroxide (MAGNESIA PO) Take 1 tablet by mouth every other day.   methocarbamol (ROBAXIN) 500 MG tablet Take 1 tablet (500 mg total) by mouth every 8 (eight) hours as needed.   Multiple Vitamins-Minerals (PRESERVISION AREDS 2 PO) Take 2 tablets by mouth daily.   Omega-3 Fatty Acids (FISH OIL PO) Take 1 capsule by mouth daily.   ondansetron (ZOFRAN) 4 MG tablet Take 1 tablet (4 mg total) by mouth every 8 (eight) hours as  needed for nausea or vomiting.   OVER THE COUNTER MEDICATION Take 1 tablet by mouth daily. Baicalin otc supplement   OVER THE COUNTER MEDICATION Take 1 tablet by mouth daily. Arterosil otc supplement   OVER THE COUNTER MEDICATION Apply 1 application topically 2 (two) times daily as needed (pain). CBD liquid   risedronate (ACTONEL) 150 MG tablet Take 150 mg by mouth every 30 (thirty) days.   traMADol (ULTRAM) 50 MG tablet Take 2 tablets (100 mg total) by mouth every 6 (six) hours as needed.   TURMERIC CURCUMIN PO Take 1 tablet by mouth daily.   No current facility-administered medications for this visit. (Other)   Facility-Administered Medications Ordered in Other Visits (Other)  Medication Route   sodium chloride flush (NS) 0.9 % injection 32 mL Intracatheter      REVIEW OF SYSTEMS: ROS   Negative for: Constitutional, Gastrointestinal, Neurological, Skin, Genitourinary, Musculoskeletal, HENT, Endocrine, Cardiovascular, Eyes, Respiratory, Psychiatric, Allergic/Imm, Heme/Lymph Last edited by Orene Desanctis D, CMA on 09/03/2022  8:24 AM.       ALLERGIES Allergies  Allergen Reactions   Black Walnut Pollen Allergy Skin Test Nausea Only    migraines   Amoxicillin Nausea Only   [place] Wax     Migraines    Chocolate     migraines   Gluten Meal     migraines  Iodine     Prolonged exposure causes itching   Latex     Prolonged exposure causes itching/rash   Oxycodone-Acetaminophen Nausea And Vomiting   Potassium Hydroxide     Migraines    Sodium Magnesium Aluminosilicate [Silicic Acid]     Migraines    Sodium Starch Glycolate [Sodium]     Migraines    Sorbitan     Sorbitol/sorbitan migraines   Sulfa Antibiotics Itching   Sulfate     migraines    PAST MEDICAL HISTORY Past Medical History:  Diagnosis Date   High vitamin D level    Macular degeneration    Migraine headache    MVP (mitral valve prolapse)    Osteoporosis    Protrusion of cervical intervertebral disc     PVC (premature ventricular contraction)    Right shoulder pain    Sinusitis    Vaginal discomfort    Varicose veins of right lower extremity with pain    Past Surgical History:  Procedure Laterality Date   ANTERIOR CRUCIATE LIGAMENT REPAIR Left 01/13/2022   Procedure: left knee anterior cruciate ligament reconstruction with bone patella tendon bone allograft; meniscal debridement  arthroscopy;  Surgeon: Meredith Pel, MD;  Location: [facility];  Service: Orthopedics;  Laterality: Left;   APPENDECTOMY     BREAST LUMPECTOMY     CESAREAN SECTION     KNEE ARTHROSCOPY Left    WRIST SURGERY Left 05/2021   ORIF    FAMILY HISTORY Family History  Problem Relation Age of Onset   Alzheimer's disease Mother    Hypertension Father    Cancer Father        prostate   Bipolar disorder Sister    Kidney disease Sister    Hepatitis Brother     SOCIAL HISTORY Social History   Tobacco Use   Smoking status: Never   Smokeless tobacco: Never  Vaping Use   Vaping Use: Never used  Substance Use Topics   Alcohol use: No   Drug use: Never         OPHTHALMIC EXAM:  Base Eye Exam     Visual Acuity (ETDRS)       Right Left   Dist [place] 20/20 -1 20/25 -2         Tonometry (Tonopen, 8:29 AM)       Right Left   Pressure 6 8         Pupils       Pupils   Right PERRL   Left PERRL         Visual Fields       Left Right    Full Full         Extraocular Movement       Right Left    Ortho Ortho    -- -- --  --  --  -- -- --   -- -- --  --  --  -- -- --           Neuro/Psych     Oriented x3: Yes   Mood/Affect: Normal         Dilation     Both eyes: 1.0% Mydriacyl, 2.5% Phenylephrine @ 8:25 AM           Slit Lamp and Fundus Exam     External Exam       Right Left   External Normal Normal         Slit Lamp Exam  Right Left   Lids/Lashes Normal Normal   Conjunctiva/Sclera White and quiet White and quiet   Cornea Clear Clear    Anterior Chamber Deep and quiet Deep and quiet   Iris Round and reactive Round and reactive   Lens 1+ Nuclear sclerosis 1+ Nuclear sclerosis   Anterior Vitreous Normal Normal         Fundus Exam       Right Left   Posterior Vitreous Normal Normal   Disc Normal Normal   C/D Ratio 0.1 0.1   Macula Soft drusen, no exudates, no macular thickening Soft drusen, no exudates, no macular thickening   Vessels Normal Normal   Periphery Normal Normal            IMAGING AND PROCEDURES  Imaging and Procedures for 09/03/22  OCT, Retina - OU - Both Eyes       Right Eye Quality was good. Scan locations included subfoveal. Central Foveal Thickness: 308. Progression has been stable. Findings include no IRF, no SRF, abnormal foveal contour, retinal drusen .   Left Eye Quality was good. Scan locations included subfoveal. Central Foveal Thickness: 316. Progression has been stable. Findings include no IRF, no SRF, abnormal foveal contour, retinal drusen .   Notes Posterior vitreous detachment OU, no vitreal retinal traction             ASSESSMENT/PLAN:  Nuclear sclerotic cataract of right eye Moderate NSC changes OU.  Follow-up with [facility] as scheduled  Nuclear sclerotic cataract of left eye Moderate NSC OU  Intermediate stage nonexudative age-related macular degeneration of both eyes The nature of age--related macular degeneration was discussed with the patient as well as the distinction between dry and wet types. Checking an Amsler Grid daily with advice to return immediately should a distortion develop, was given to the patient. The patient 's smoking status now and in the past was determined and advice based on the AREDS study was provided regarding the consumption of antioxidant supplements. AREDS 2 vitamin formulation was recommended. Consumption of dark leafy vegetables and fresh fruits of various colors was recommended. Treatment modalities for wet macular  degeneration particularly the use of intravitreal injections of anti-blood vessel growth factors was discussed with the patient. Avastin, Lucentis, and Eylea are the available options. On occasion, therapy includes the use of photodynamic therapy and thermal laser. Stressed to the patient do not rub eyes.  Patient was advised to check Amsler Grid daily and return immediately if changes are noted. Instructions on using the grid were given to the patient. All patient questions were answered.  Does not need nor qualify for Syfovre.  No evidence of geographic atrophy  Other vitreous opacities, right eye Minor OD     ICD-10-CM   1. Intermediate stage nonexudative age-related macular degeneration of both eyes  H35.3132 OCT, Retina - OU - Both Eyes    2. Nuclear sclerotic cataract of right eye  H25.11     3. Nuclear sclerotic cataract of left eye  H25.12     4. Other vitreous opacities, right eye  H43.391       1.  OU, patient to notify the offices promptly should new onset distortion or visual decline occur in either eye.  2.  3.  Ophthalmic Meds Ordered this visit:  No orders of the defined types were placed in this encounter.      Return in about 1 year (around 09/04/2023) for DILATE OU, OCT.  There are no Patient Instructions on file  for this visit.   Explained the diagnoses, plan, and follow up with the patient and they expressed understanding.  Patient expressed understanding of the importance of proper follow up care.   Clent Demark Zacharia Sowles M.D. Diseases & Surgery of the Retina and Vitreous Retina & Diabetic [place] 09/03/22     Abbreviations: M myopia (nearsighted); A astigmatism; H hyperopia (farsighted); P presbyopia; Mrx spectacle prescription;  CTL contact lenses; OD right eye; OS left eye; OU both eyes  XT exotropia; ET esotropia; PEK punctate epithelial keratitis; PEE punctate epithelial erosions; DES dry eye syndrome; MGD meibomian gland dysfunction; ATs artificial  tears; PFAT's preservative free artificial tears; [place] nuclear sclerotic cataract; PSC posterior subcapsular cataract; ERM epi-retinal membrane; PVD posterior vitreous detachment; RD retinal detachment; DM diabetes mellitus; DR diabetic retinopathy; NPDR non-proliferative diabetic retinopathy; PDR proliferative diabetic retinopathy; CSME clinically significant macular edema; DME diabetic macular edema; dbh dot blot hemorrhages; CWS cotton wool spot; POAG primary open angle glaucoma; C/D cup-to-disc ratio; HVF humphrey visual field; GVF goldmann visual field; OCT optical coherence tomography; IOP intraocular pressure; BRVO Branch retinal vein occlusion; CRVO central retinal vein occlusion; CRAO central retinal artery occlusion; BRAO branch retinal artery occlusion; RT retinal tear; SB scleral buckle; PPV pars plana vitrectomy; VH Vitreous hemorrhage; PRP panretinal laser photocoagulation; IVK intravitreal kenalog; VMT vitreomacular traction; MH Macular hole;  NVD neovascularization of the disc; NVE neovascularization elsewhere; AREDS age related eye disease study; ARMD age related macular degeneration; POAG primary open angle glaucoma; EBMD epithelial/anterior basement membrane dystrophy; ACIOL anterior chamber intraocular lens; IOL intraocular lens; PCIOL posterior chamber intraocular lens; Phaco/IOL phacoemulsification with intraocular lens placement; [place] photorefractive keratectomy; LASIK laser assisted in situ keratomileusis; HTN hypertension; DM diabetes mellitus; COPD chronic obstructive pulmonary disease

## 2022-09-03 NOTE — Assessment & Plan Note (Signed)
Moderate [place] OU

## 2022-09-03 NOTE — Assessment & Plan Note (Signed)
Moderate NSC changes OU.  Follow-up with [facility] as scheduled

## 2022-09-03 NOTE — Assessment & Plan Note (Signed)
Minor OD

## 2022-09-03 NOTE — Assessment & Plan Note (Signed)
The nature of age--related macular degeneration was discussed with the patient as well as the distinction between dry and wet types. Checking an Amsler Grid daily with advice to return immediately should a distortion develop, was given to the patient. The patient 's smoking status now and in the past was determined and advice based on the AREDS study was provided regarding the consumption of antioxidant supplements. AREDS 2 vitamin formulation was recommended. Consumption of dark leafy vegetables and fresh fruits of various colors was recommended. Treatment modalities for wet macular degeneration particularly the use of intravitreal injections of anti-blood vessel growth factors was discussed with the patient. Avastin, Lucentis, and Eylea are the available options. On occasion, therapy includes the use of photodynamic therapy and thermal laser. Stressed to the patient do not rub eyes.  Patient was advised to check Amsler Grid daily and return immediately if changes are noted. Instructions on using the grid were given to the patient. All patient questions were answered.  Does not need nor qualify for Syfovre.  No evidence of geographic atrophy

## 2022-09-14 ENCOUNTER — Ambulatory Visit: Payer: Medicare PPO | Admitting: Surgical

## 2022-09-14 ENCOUNTER — Encounter: Payer: Self-pay | Admitting: Surgical

## 2022-09-14 DIAGNOSIS — Z9889 Other specified postprocedural states: Secondary | ICD-10-CM

## 2022-09-14 NOTE — Progress Notes (Signed)
Post-Op Visit Note   Patient: Alicia Green           Date of Birth: [birth year]           MRN: [id number] Visit Date: 09/14/2022 PCP: Cari Caraway, MD   Assessment & Plan:  Chief Complaint:  Chief Complaint  Patient presents with   Left Knee - Follow-up    left knee ACL reconstruction bone patella tendon bone with partial medial meniscectomy   Visit Diagnoses:  1. S/P ACL reconstruction     Plan: Patient is a 66 year old female who presents s/p left knee anterior cruciate ligament reconstruction with bone patellar tendon bone allograft and partial medial meniscectomy.  She is about 8 months out from procedure at this point.  Doing very well overall.  She is able to swim half a mile.  She rides her bike for about 10 miles at a time.  She is able to do some weight training.  She does aqua aerobics at the pool and is able to squat well without any pain in the water but does note that squatting outside of the water on a normal gym floor does seem to bother her in the medial aspect of her knee.  This is where most of her pain is when she notices pain.  Pain is inconsistent and will occasionally flareup based on how active she is but overall she feels that she is trending in the right direction.  She has no symptomatic instability or any significant mechanical symptoms in the knee.  No swelling.  On exam, patient has 0 degrees extension and greater than 125 degrees of knee flexion.  She has no calf tenderness.  Negative Homans' sign.  Excellent stability to anterior and posterior drawer.  Stable on Lachman exam with only about 1 to 2 mm of laxity.  She has no lateral joint line tenderness but does have some mild medial joint line tenderness.  Stable to varus and valgus stress at 0 and 30 degrees.  She is able to perform straight leg raise without extensor lag and has excellent quad strength rated 5/5.  Plan is to start cutting and pivoting exercises.  She wants to eventually return to  racquetball and with how stable her knee feels I think this should do well for her.  Do think that she should wait until about 11 to 12 months out from procedure to return to full sport due to her having allograft instead of autograft.  She agreed with this plan.  She will start some sport specific exercises in order to optimize transition back to sport and a therapy prescription was given to her today for her to work with her physical therapist on training these exercises.  Did recommend she stay out of skiing until next season.  Follow-up for final check in 4 months which will be about the year mark.  Follow-Up Instructions: No follow-ups on file.   Orders:  No orders of the defined types were placed in this encounter.  No orders of the defined types were placed in this encounter.   Imaging: No results found.  PMFS History: Patient Active Problem List   Diagnosis Date Noted   Left anterior cruciate ligament tear    Acute medial meniscus tear of left knee    Intermediate stage nonexudative age-related macular degeneration of both eyes 09/03/2020   Nuclear sclerotic cataract of right eye 09/03/2020   Other vitreous opacities, right eye 09/03/2020   Nuclear sclerotic cataract of left eye  09/03/2020   Past Medical History:  Diagnosis Date   High vitamin D level    Macular degeneration    Migraine headache    MVP (mitral valve prolapse)    Osteoporosis    Protrusion of cervical intervertebral disc    PVC (premature ventricular contraction)    Right shoulder pain    Sinusitis    Vaginal discomfort    Varicose veins of right lower extremity with pain     Family History  Problem Relation Age of Onset   Alzheimer's disease Mother    Hypertension Father    Cancer Father        prostate   Bipolar disorder Sister    Kidney disease Sister    Hepatitis Brother     Past Surgical History:  Procedure Laterality Date   ANTERIOR CRUCIATE LIGAMENT REPAIR Left 01/13/2022   Procedure:  left knee anterior cruciate ligament reconstruction with bone patella tendon bone allograft; meniscal debridement  arthroscopy;  Surgeon: Meredith Pel, MD;  Location: [facility];  Service: Orthopedics;  Laterality: Left;   APPENDECTOMY     BREAST LUMPECTOMY     CESAREAN SECTION     KNEE ARTHROSCOPY Left    WRIST SURGERY Left 05/2021   ORIF   Social History   Occupational History   Not on file  Tobacco Use   Smoking status: Never   Smokeless tobacco: Never  Vaping Use   Vaping Use: Never used  Substance and Sexual Activity   Alcohol use: No   Drug use: Never   Sexual activity: Not on file

## 2022-09-21 DIAGNOSIS — M25562 Pain in left knee: Secondary | ICD-10-CM | POA: Diagnosis not present

## 2022-12-16 ENCOUNTER — Telehealth: Payer: Self-pay | Admitting: Surgical

## 2022-12-16 NOTE — Telephone Encounter (Signed)
Called patient to reschedule her 4 month follow up with Dr. Marlou Sa from a morning appointment to an afternoon appointment due to a surgery being scheduled on 01-15-23 (Friday).  Patient stated she is doing fine right now and does not plan on doing any skiing any time in the immediate future and was thinking about pushing the appointment out.  Even thought this is her one year mark she says she prefers to wait and schedule the appointment closer to when she plans to ski again.  No appointment made or rescheduled.  Patient will call.

## 2022-12-17 NOTE — Telephone Encounter (Signed)
Sounds good to me thanks Jackelyn Poling

## 2023-01-01 DIAGNOSIS — R5383 Other fatigue: Secondary | ICD-10-CM | POA: Diagnosis not present

## 2023-01-01 DIAGNOSIS — M791 Myalgia, unspecified site: Secondary | ICD-10-CM | POA: Diagnosis not present

## 2023-01-01 DIAGNOSIS — Z6820 Body mass index (BMI) 20.0-20.9, adult: Secondary | ICD-10-CM | POA: Diagnosis not present

## 2023-01-01 DIAGNOSIS — J09X2 Influenza due to identified novel influenza A virus with other respiratory manifestations: Secondary | ICD-10-CM | POA: Diagnosis not present

## 2023-01-15 ENCOUNTER — Ambulatory Visit: Payer: Medicare PPO | Admitting: Orthopedic Surgery

## 2023-03-10 ENCOUNTER — Encounter: Payer: Self-pay | Admitting: Surgical

## 2023-03-10 ENCOUNTER — Other Ambulatory Visit: Payer: Self-pay | Admitting: Family Medicine

## 2023-03-10 ENCOUNTER — Ambulatory Visit: Payer: Medicare PPO | Admitting: Surgical

## 2023-03-10 DIAGNOSIS — Q141 Congenital malformation of retina: Secondary | ICD-10-CM | POA: Diagnosis not present

## 2023-03-10 DIAGNOSIS — Z0000 Encounter for general adult medical examination without abnormal findings: Secondary | ICD-10-CM

## 2023-03-10 DIAGNOSIS — H43813 Vitreous degeneration, bilateral: Secondary | ICD-10-CM | POA: Diagnosis not present

## 2023-03-10 DIAGNOSIS — M25562 Pain in left knee: Secondary | ICD-10-CM | POA: Diagnosis not present

## 2023-03-10 DIAGNOSIS — H35363 Drusen (degenerative) of macula, bilateral: Secondary | ICD-10-CM | POA: Diagnosis not present

## 2023-03-10 DIAGNOSIS — H2513 Age-related nuclear cataract, bilateral: Secondary | ICD-10-CM | POA: Diagnosis not present

## 2023-03-10 DIAGNOSIS — M810 Age-related osteoporosis without current pathological fracture: Secondary | ICD-10-CM

## 2023-03-10 DIAGNOSIS — Z9889 Other specified postprocedural states: Secondary | ICD-10-CM | POA: Diagnosis not present

## 2023-03-10 NOTE — Progress Notes (Signed)
Office Visit Note   Patient: Alicia Green           Date of Birth: [birth year]           MRN: [id number] Visit Date: 03/10/2023 Requested by: Cari Caraway, [place],  [place] [address] PCP: Cari Caraway, MD  Subjective: Chief Complaint  Patient presents with   Left Knee - Follow-up    HPI: Alicia Green is a 67 y.o. female who presents to the office reporting left knee transient weakness.  Patient has history of left knee anterior cruciate ligament reconstruction with bone patellar tendon bone allograft with B MAC augmentation that was done on 01/13/2022 by Dr. Marlou Sa.  She returns about 14 months out from procedure.  She has been doing very well with her knee with no instability and no significant pain.  She feels she is about at 80% but she is a little frustrated with her return to playing racquetball.  She has been doing racquetball lessons over the last several months and feels this was going well.  She went back to playing in racquetball matches about 3 to 4 weeks ago.  After the first match, she feels like the subsequent matches she noticed a difficulty with her pushoff and side-to-side mobility strength.  She is a very fit person and enjoys doing a lot of pool exercises such as swimming 1 mile, doing sidesteps and sprints in the side of a pool.  She also does strength training.  She has only been doing occasional matches and racquetball for about 3 to 4 weeks.  Aside from her racquetball games, she really has no complaints about the knee.  She has no mechanical symptoms or significant pain.  The knee never buckles on her..                ROS: All systems reviewed are negative as they relate to the chief complaint within the history of present illness.  Patient denies fevers or chills.  Assessment & Plan: Visit Diagnoses: No diagnosis found.  Plan: Patient is a 67 year old female who presents for evaluation of left knee transient weakness 14 months out from ACL  reconstruction with bone patellar tendon bone allograft.  She has been doing very well and she has no hint of ACL instability on exam today.  Mostly has difficulty with her pushoff strength and with her side-to-side mobility when she is playing in her racquetball matches.  Admittedly, she has not really had any long period of time since returning to the rectum all matches and is only been 3 to 4 weeks.  We had long discussion about recovery timeline and she understands that college level athletes generally return to sport after 12 months.  She may need to just give it more time of proprioception and strengthening exercises before she will feel close to her former level of competition for racquetball.  She does have a little bit of decreased robust hamstring strength compared with the quad strength she has which may be something she can focus on.  Additionally, she has some asymmetric pes anserine bursa tenderness over the left tibia relative to the right.  There is no bursal distention on ultrasound exam today.  After discussion of options, she would like to try to focus a little bit more and hamstring strengthening and give this more time as well as trying some ice and topical anti-inflammatories over this pes anserine bursa.  Will see how this does for her and  she will follow-up with the office as needed.  We could consider diagnostic Marcaine or Marcaine/cortisone injection into the pes anserine bursa prior to a sporting event for her to see if this pain is somewhat responsible for her subjective weakness.  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  Follow-up as needed.  Follow-Up Instructions: No follow-ups on file.   Orders:  No orders of the defined types were placed in this encounter.  No orders of the defined types were placed in this encounter.     Procedures: No procedures  performed   Clinical Data: No additional findings.  Objective: Vital Signs: There were no vitals taken for this visit.  Physical Exam:  Constitutional: Patient appears well-developed HEENT:  Head: Normocephalic Eyes:EOM are normal Neck: Normal range of motion Cardiovascular: Normal rate Pulmonary/chest: Effort normal Neurologic: Patient is alert Skin: Skin is warm Psychiatric: Patient has normal mood and affect  Ortho Exam: Ortho exam demonstrates left knee with no effusion.  She has excellent quad strength rated 5/5.  She has good hamstring strength rated 5/5 but the hamstring strength does not seem quite as robust as her quad strength.  She has passive range of motion from 0 degrees to 130 degrees without any crepitus significantly.  She does have incisions that are well-healed from prior surgery.  No calf tenderness.  Negative Homans' sign.  Intact ankle dorsiflexion and plantarflexion.  Stable to varus and valgus stress at 0 and 30 degrees.  She is stable to posterior lateral rotational forces.  Excellent stability to anterior and posterior drawer with no significant increased laxity compared with contralateral extremity and she has a solid endpoint.  Excellent endpoint on Lachman exam with no significant laxity.  Ambulates without antalgia.  She does have some asymmetric tenderness over the pes anserine bursa on the left leg compared with the right.  No tenderness over the medial or lateral joint lines.  Specialty Comments:  No specialty comments available.  Imaging: No results found.   PMFS History: Patient Active Problem List   Diagnosis Date Noted   Left anterior cruciate ligament tear    Acute medial meniscus tear of left knee    Intermediate stage nonexudative age-related macular degeneration of both eyes 09/03/2020   Nuclear sclerotic cataract of right eye 09/03/2020   Other vitreous opacities, right eye 09/03/2020   Nuclear sclerotic cataract of left eye 09/03/2020    Past Medical History:  Diagnosis Date   High vitamin D level    Macular degeneration    Migraine headache    MVP (mitral valve prolapse)    Osteoporosis    Protrusion of cervical intervertebral disc    PVC (premature ventricular contraction)    Right shoulder pain    Sinusitis    Vaginal discomfort    Varicose veins of right lower extremity with pain     Family History  Problem Relation Age of Onset   Alzheimer's disease Mother    Hypertension Father    Cancer Father        prostate   Bipolar disorder Sister    Kidney disease Sister    Hepatitis Brother     Past Surgical History:  Procedure Laterality Date   ANTERIOR CRUCIATE LIGAMENT REPAIR Left 01/13/2022   Procedure: left knee anterior cruciate ligament reconstruction with bone patella tendon bone allograft; meniscal debridement  arthroscopy;  Surgeon: Meredith Pel, MD;  Location: [facility]  OR;  Service: Orthopedics;  Laterality: Left;   APPENDECTOMY     BREAST LUMPECTOMY     CESAREAN SECTION     KNEE ARTHROSCOPY Left    WRIST SURGERY Left 05/2021   [facility]   Social History   Occupational History   Not on file  Tobacco Use   Smoking status: Never   Smokeless tobacco: Never  Vaping Use   Vaping Use: Never used  Substance and Sexual Activity   Alcohol use: No   Drug use: Never   Sexual activity: Not on file

## 2023-04-26 ENCOUNTER — Ambulatory Visit
Admission: RE | Admit: 2023-04-26 | Discharge: 2023-04-26 | Disposition: A | Discharge status: Home / Self Care | Payer: Medicare PPO | Source: Ambulatory Visit | Attending: Family Medicine | Admitting: Family Medicine

## 2023-04-26 DIAGNOSIS — Z1231 Encounter for screening mammogram for malignant neoplasm of breast: Secondary | ICD-10-CM | POA: Diagnosis not present

## 2023-04-26 DIAGNOSIS — Z0000 Encounter for general adult medical examination without abnormal findings: Secondary | ICD-10-CM

## 2023-05-20 DIAGNOSIS — R5383 Other fatigue: Secondary | ICD-10-CM | POA: Diagnosis not present

## 2023-09-06 ENCOUNTER — Encounter (INDEPENDENT_AMBULATORY_CARE_PROVIDER_SITE_OTHER): Payer: Medicare PPO | Admitting: Ophthalmology

## 2023-10-25 DIAGNOSIS — Z0000 Encounter for general adult medical examination without abnormal findings: Secondary | ICD-10-CM | POA: Diagnosis not present

## 2023-10-25 DIAGNOSIS — M810 Age-related osteoporosis without current pathological fracture: Secondary | ICD-10-CM | POA: Diagnosis not present

## 2023-10-25 DIAGNOSIS — Z1331 Encounter for screening for depression: Secondary | ICD-10-CM | POA: Diagnosis not present

## 2023-10-25 DIAGNOSIS — Z6821 Body mass index (BMI) 21.0-21.9, adult: Secondary | ICD-10-CM | POA: Diagnosis not present

## 2023-10-25 DIAGNOSIS — Z9181 History of falling: Secondary | ICD-10-CM | POA: Diagnosis not present

## 2023-10-27 ENCOUNTER — Ambulatory Visit
Admission: RE | Admit: 2023-10-27 | Discharge: 2023-10-27 | Disposition: A | Discharge status: Home / Self Care | Payer: Medicare PPO | Source: Ambulatory Visit | Attending: Family Medicine | Admitting: Family Medicine

## 2023-10-27 DIAGNOSIS — M810 Age-related osteoporosis without current pathological fracture: Secondary | ICD-10-CM

## 2023-10-27 DIAGNOSIS — N958 Other specified menopausal and perimenopausal disorders: Secondary | ICD-10-CM | POA: Diagnosis not present

## 2023-10-27 DIAGNOSIS — M8588 Other specified disorders of bone density and structure, other site: Secondary | ICD-10-CM | POA: Diagnosis not present

## 2023-10-27 DIAGNOSIS — E2839 Other primary ovarian failure: Secondary | ICD-10-CM | POA: Diagnosis not present

## 2023-11-03 DIAGNOSIS — H43813 Vitreous degeneration, bilateral: Secondary | ICD-10-CM | POA: Diagnosis not present

## 2023-11-03 DIAGNOSIS — H43391 Other vitreous opacities, right eye: Secondary | ICD-10-CM | POA: Diagnosis not present

## 2023-11-03 DIAGNOSIS — H353132 Nonexudative age-related macular degeneration, bilateral, intermediate dry stage: Secondary | ICD-10-CM | POA: Diagnosis not present

## 2023-11-03 DIAGNOSIS — H2513 Age-related nuclear cataract, bilateral: Secondary | ICD-10-CM | POA: Diagnosis not present

## 2023-11-29 DIAGNOSIS — L821 Other seborrheic keratosis: Secondary | ICD-10-CM | POA: Diagnosis not present

## 2023-11-29 DIAGNOSIS — L814 Other melanin hyperpigmentation: Secondary | ICD-10-CM | POA: Diagnosis not present

## 2023-11-29 DIAGNOSIS — D2361 Other benign neoplasm of skin of right upper limb, including shoulder: Secondary | ICD-10-CM | POA: Diagnosis not present

## 2023-11-29 DIAGNOSIS — D224 Melanocytic nevi of scalp and neck: Secondary | ICD-10-CM | POA: Diagnosis not present

## 2024-04-20 DIAGNOSIS — H43813 Vitreous degeneration, bilateral: Secondary | ICD-10-CM | POA: Diagnosis not present

## 2024-04-20 DIAGNOSIS — H353132 Nonexudative age-related macular degeneration, bilateral, intermediate dry stage: Secondary | ICD-10-CM | POA: Diagnosis not present

## 2024-04-20 DIAGNOSIS — H2513 Age-related nuclear cataract, bilateral: Secondary | ICD-10-CM | POA: Diagnosis not present

## 2024-04-20 DIAGNOSIS — B880 Other acariasis: Secondary | ICD-10-CM | POA: Diagnosis not present

## 2024-04-20 DIAGNOSIS — Q141 Congenital malformation of retina: Secondary | ICD-10-CM | POA: Diagnosis not present

## 2024-06-15 DIAGNOSIS — M79671 Pain in right foot: Secondary | ICD-10-CM | POA: Diagnosis not present

## 2024-06-22 ENCOUNTER — Ambulatory Visit: Admitting: Surgical

## 2024-07-04 DIAGNOSIS — H01005 Unspecified blepharitis left lower eyelid: Secondary | ICD-10-CM | POA: Diagnosis not present

## 2024-07-04 DIAGNOSIS — H01001 Unspecified blepharitis right upper eyelid: Secondary | ICD-10-CM | POA: Diagnosis not present

## 2024-07-04 DIAGNOSIS — B880 Other acariasis: Secondary | ICD-10-CM | POA: Diagnosis not present

## 2024-07-14 ENCOUNTER — Ambulatory Visit: Admitting: Surgical

## 2024-07-17 ENCOUNTER — Ambulatory Visit: Admitting: Surgical

## 2024-07-20 ENCOUNTER — Other Ambulatory Visit (INDEPENDENT_AMBULATORY_CARE_PROVIDER_SITE_OTHER): Payer: Self-pay

## 2024-07-20 ENCOUNTER — Ambulatory Visit: Admitting: Surgical

## 2024-07-20 DIAGNOSIS — M25562 Pain in left knee: Secondary | ICD-10-CM

## 2024-07-20 DIAGNOSIS — Z9889 Other specified postprocedural states: Secondary | ICD-10-CM | POA: Diagnosis not present

## 2024-07-20 DIAGNOSIS — G8929 Other chronic pain: Secondary | ICD-10-CM | POA: Diagnosis not present

## 2024-07-21 ENCOUNTER — Encounter: Payer: Self-pay | Admitting: Surgical

## 2024-07-21 NOTE — Progress Notes (Signed)
 Office Visit Note   Patient: Alicia Green           Date of Birth: 02-21-1956           MRN: [id number] Visit Date: 07/20/2024 Requested by: Aisha Harvey, MD [address] [place],  [place] [address] PCP: Aisha Harvey, MD  Subjective: Chief Complaint  Patient presents with   Left Knee - Pain    HPI: Alicia Green is a 68 y.o. female who presents to the office reporting left knee swelling.  Patient has history of left knee anterior cruciate ligament reconstruction on 01/13/2022 by Dr. Addie.  She states that her knee is overall doing well.  The knee is very stable and she never has any complaints of instability.  She plays high-level racquetball and tournaments and is very satisfied with how her knee feels.  Her only concern is that she has intermittent swelling and some discomfort in the medial aspect of the knee at times.  She uses ice and Advil  as needed.  She denies any mechanical symptoms in the knee or any new injury.              ROS: All systems reviewed are negative as they relate to the chief complaint within the history of present illness.  Patient denies fevers or chills.  Assessment & Plan: Visit Diagnoses:  1. Chronic pain of left knee     Plan: Impression is 68 year old female who is doing very well following ACL reconstruction done about 2.5 years ago.  She has returned to competing at a high level in her sport of racquetball.  No complaint of instability.  She does have some discomfort in the medial aspect of the knee which is consistent with the grade I-II chondromalacia that was noted on arthroscopy at the time of procedure.  We discussed options available to patient regarding the discomfort medially as well as the effusion such as living with her symptoms with occasional anti-inflammatory use versus aspiration/injection with cortisone versus PRP.  Do not think that there is any need for further imaging at this time unless she has new injury or develops more  mechanical symptoms.  Patient is interested in optimizing her quadricep strength and we discussed the possibility of trying blood flow restriction therapy.  She Alicia consider this option though she would like to reach out to her vascular doctor before considering this.  Follow-up with the office as needed  Follow-Up Instructions: No follow-ups on file.   Orders:  Orders Placed This Encounter  Procedures   XR KNEE 3 VIEW LEFT   No orders of the defined types were placed in this encounter.     Procedures: No procedures performed   Clinical Data: No additional findings.  Objective: Vital Signs: There were no vitals taken for this visit.  Physical Exam:  Constitutional: Patient appears well-developed HEENT:  Head: Normocephalic Eyes:EOM are normal Neck: Normal range of motion Cardiovascular: Normal rate Pulmonary/chest: Effort normal Neurologic: Patient is alert Skin: Skin is warm Psychiatric: Patient has normal mood and affect  Ortho Exam: Ortho exam demonstrates left knee with positive effusion.  Has 0 degrees extension and greater than 125 degrees of knee flexion.  No calf tenderness.  Negative Homans' sign.  Palpable DP pulse.  She has excellent quad strength and hamstring strength rated 5/5.  Stable to anterior and posterior drawer sign.  Stable to varus and valgus stress at 0 and 30 degrees.  She has excellent stability with positive endpoint on  Lachman exam.  No posterolateral rotational instability.  She has no tenderness over the lateral joint line or over the pes anserine bursa.  She does have some mild tenderness over the anterior medial joint line.  Specialty Comments:  No specialty comments available.  Imaging: No results found.   PMFS History: Patient Active Problem List   Diagnosis Date Noted   Left anterior cruciate ligament tear    Acute medial meniscus tear of left knee    Intermediate stage nonexudative age-related macular degeneration of both eyes  09/03/2020   Nuclear sclerotic cataract of right eye 09/03/2020   Other vitreous opacities, right eye 09/03/2020   Nuclear sclerotic cataract of left eye 09/03/2020   Past Medical History:  Diagnosis Date   High vitamin D level    Macular degeneration    Migraine headache    MVP (mitral valve prolapse)    Osteoporosis    Protrusion of cervical intervertebral disc    PVC (premature ventricular contraction)    Right shoulder pain    Sinusitis    Vaginal discomfort    Varicose veins of right lower extremity with pain     Family History  Problem Relation Age of Onset   Alzheimer's disease Mother    Hypertension Father    Cancer Father        prostate   Bipolar disorder Sister    Kidney disease Sister    Hepatitis Brother     Past Surgical History:  Procedure Laterality Date   ANTERIOR CRUCIATE LIGAMENT REPAIR Left 01/13/2022   Procedure: left knee anterior cruciate ligament reconstruction with bone patella tendon bone allograft; meniscal debridement  arthroscopy;  Surgeon: Addie Cordella Hamilton, MD;  Location: [facility] OR;  Service: Orthopedics;  Laterality: Left;   APPENDECTOMY     BREAST LUMPECTOMY     CESAREAN SECTION     KNEE ARTHROSCOPY Left    WRIST SURGERY Left 05/2021   [facility]   Social History   Occupational History   Not on file  Tobacco Use   Smoking status: Never   Smokeless tobacco: Never  Vaping Use   Vaping status: Never Used  Substance and Sexual Activity   Alcohol use: No   Drug use: Never   Sexual activity: Not on file

## 2024-10-23 ENCOUNTER — Encounter: Payer: Self-pay | Admitting: Radiology

## 2024-10-23 DIAGNOSIS — M810 Age-related osteoporosis without current pathological fracture: Secondary | ICD-10-CM | POA: Diagnosis not present

## 2024-10-25 DIAGNOSIS — Z13220 Encounter for screening for lipoid disorders: Secondary | ICD-10-CM | POA: Diagnosis not present

## 2024-10-25 DIAGNOSIS — N952 Postmenopausal atrophic vaginitis: Secondary | ICD-10-CM | POA: Diagnosis not present

## 2024-10-25 DIAGNOSIS — Z6822 Body mass index (BMI) 22.0-22.9, adult: Secondary | ICD-10-CM | POA: Diagnosis not present

## 2024-10-25 DIAGNOSIS — J3489 Other specified disorders of nose and nasal sinuses: Secondary | ICD-10-CM | POA: Diagnosis not present

## 2024-10-25 DIAGNOSIS — Z23 Encounter for immunization: Secondary | ICD-10-CM | POA: Diagnosis not present

## 2024-10-25 DIAGNOSIS — M810 Age-related osteoporosis without current pathological fracture: Secondary | ICD-10-CM | POA: Diagnosis not present

## 2024-10-25 DIAGNOSIS — Z1231 Encounter for screening mammogram for malignant neoplasm of breast: Secondary | ICD-10-CM | POA: Diagnosis not present

## 2024-10-25 DIAGNOSIS — Z1211 Encounter for screening for malignant neoplasm of colon: Secondary | ICD-10-CM | POA: Diagnosis not present

## 2024-10-25 DIAGNOSIS — Z0000 Encounter for general adult medical examination without abnormal findings: Secondary | ICD-10-CM | POA: Diagnosis not present

## 2024-11-01 DIAGNOSIS — H353132 Nonexudative age-related macular degeneration, bilateral, intermediate dry stage: Secondary | ICD-10-CM | POA: Diagnosis not present

## 2024-11-01 DIAGNOSIS — H43391 Other vitreous opacities, right eye: Secondary | ICD-10-CM | POA: Diagnosis not present

## 2024-11-01 DIAGNOSIS — H2513 Age-related nuclear cataract, bilateral: Secondary | ICD-10-CM | POA: Diagnosis not present

## 2024-11-01 DIAGNOSIS — H43813 Vitreous degeneration, bilateral: Secondary | ICD-10-CM | POA: Diagnosis not present
